# Patient Record
Sex: Female | Born: 1965 | Race: White | Hispanic: No | Marital: Single | Smoking: Never smoker
Health system: Southern US, Community
[De-identification: ages and names within clinical notes are randomized; demographics above are authoritative.]

## PROBLEM LIST (undated history)

## (undated) DIAGNOSIS — E663 Overweight: Secondary | ICD-10-CM

## (undated) DIAGNOSIS — R350 Frequency of micturition: Secondary | ICD-10-CM

## (undated) DIAGNOSIS — K219 Gastro-esophageal reflux disease without esophagitis: Secondary | ICD-10-CM

## (undated) DIAGNOSIS — R351 Nocturia: Secondary | ICD-10-CM

## (undated) HISTORY — DX: Nocturia: R35.1

## (undated) HISTORY — DX: Gastro-esophageal reflux disease without esophagitis: K21.9

## (undated) HISTORY — DX: Essential (primary) hypertension: I10

## (undated) HISTORY — DX: Overweight: E66.3

## (undated) HISTORY — DX: Frequency of micturition: R35.0

---

## 2004-07-28 ENCOUNTER — Ambulatory Visit: Payer: Self-pay | Admitting: Internal Medicine

## 2004-07-29 ENCOUNTER — Ambulatory Visit: Payer: Self-pay | Admitting: Internal Medicine

## 2004-09-26 ENCOUNTER — Other Ambulatory Visit: Payer: Self-pay

## 2004-09-26 ENCOUNTER — Emergency Department: Payer: Self-pay | Admitting: Emergency Medicine

## 2004-11-11 ENCOUNTER — Encounter: Payer: Self-pay | Admitting: Orthopedic Surgery

## 2004-12-02 ENCOUNTER — Encounter: Payer: Self-pay | Admitting: Orthopedic Surgery

## 2005-01-01 ENCOUNTER — Encounter: Payer: Self-pay | Admitting: Orthopedic Surgery

## 2005-02-01 ENCOUNTER — Encounter: Payer: Self-pay | Admitting: Orthopedic Surgery

## 2005-03-18 ENCOUNTER — Ambulatory Visit: Payer: Self-pay

## 2005-07-30 ENCOUNTER — Ambulatory Visit: Payer: Self-pay | Admitting: Internal Medicine

## 2006-09-28 ENCOUNTER — Ambulatory Visit: Payer: Self-pay | Admitting: Internal Medicine

## 2007-09-01 ENCOUNTER — Ambulatory Visit: Payer: Self-pay | Admitting: Internal Medicine

## 2007-09-29 ENCOUNTER — Ambulatory Visit: Payer: Self-pay | Admitting: Internal Medicine

## 2008-10-21 ENCOUNTER — Ambulatory Visit: Payer: Self-pay | Admitting: Family Medicine

## 2008-10-23 ENCOUNTER — Ambulatory Visit: Payer: Self-pay | Admitting: Internal Medicine

## 2009-02-03 ENCOUNTER — Ambulatory Visit: Payer: Self-pay | Admitting: Gastroenterology

## 2009-11-18 ENCOUNTER — Ambulatory Visit: Payer: Self-pay | Admitting: Internal Medicine

## 2010-07-01 ENCOUNTER — Ambulatory Visit: Payer: Self-pay

## 2010-12-29 ENCOUNTER — Ambulatory Visit: Payer: Self-pay

## 2011-07-31 ENCOUNTER — Emergency Department: Payer: Self-pay | Admitting: *Deleted

## 2011-08-01 LAB — COMPREHENSIVE METABOLIC PANEL
Albumin: 4.4 g/dL (ref 3.4–5.0)
Alkaline Phosphatase: 96 U/L (ref 50–136)
BUN: 11 mg/dL (ref 7–18)
Bilirubin,Total: 0.4 mg/dL (ref 0.2–1.0)
Calcium, Total: 9.5 mg/dL (ref 8.5–10.1)
Chloride: 104 mmol/L (ref 98–107)
Glucose: 125 mg/dL — ABNORMAL HIGH (ref 65–99)
Potassium: 3.8 mmol/L (ref 3.5–5.1)
SGOT(AST): 22 U/L (ref 15–37)
SGPT (ALT): 23 U/L
Total Protein: 8.1 g/dL (ref 6.4–8.2)

## 2011-08-01 LAB — CBC
HGB: 14.4 g/dL (ref 12.0–16.0)
MCHC: 33.5 g/dL (ref 32.0–36.0)
MCV: 95 fL (ref 80–100)
Platelet: 210 10*3/uL (ref 150–440)
RDW: 12.7 % (ref 11.5–14.5)

## 2011-08-01 LAB — URINALYSIS, COMPLETE
Ketone: NEGATIVE
Protein: NEGATIVE
RBC,UR: NONE SEEN /HPF (ref 0–5)
Specific Gravity: 1.003 (ref 1.003–1.030)
WBC UR: NONE SEEN /HPF (ref 0–5)

## 2011-08-01 LAB — LIPASE, BLOOD: Lipase: 123 U/L (ref 73–393)

## 2011-08-01 LAB — PREGNANCY, URINE: Pregnancy Test, Urine: NEGATIVE m[IU]/mL

## 2011-12-02 ENCOUNTER — Emergency Department: Payer: Self-pay | Admitting: Unknown Physician Specialty

## 2012-01-03 ENCOUNTER — Ambulatory Visit: Payer: Self-pay

## 2013-01-03 ENCOUNTER — Ambulatory Visit: Payer: Self-pay

## 2014-01-04 ENCOUNTER — Ambulatory Visit: Payer: Self-pay

## 2014-09-27 ENCOUNTER — Encounter: Payer: Self-pay | Admitting: *Deleted

## 2014-10-08 ENCOUNTER — Ambulatory Visit (INDEPENDENT_AMBULATORY_CARE_PROVIDER_SITE_OTHER): Payer: 59 | Admitting: Urology

## 2014-10-08 ENCOUNTER — Encounter: Payer: Self-pay | Admitting: Urology

## 2014-10-08 VITALS — BP 124/77 | HR 63 | Ht 64.75 in | Wt 182.2 lb

## 2014-10-08 DIAGNOSIS — R351 Nocturia: Secondary | ICD-10-CM | POA: Insufficient documentation

## 2014-10-08 DIAGNOSIS — R350 Frequency of micturition: Secondary | ICD-10-CM | POA: Insufficient documentation

## 2014-10-08 LAB — BLADDER SCAN AMB NON-IMAGING: Scan Result: 0

## 2014-10-08 NOTE — Progress Notes (Signed)
10/08/2014 10:11 AM   Margaret Lynch 06/23/1965 [id number]  Referring provider: No referring provider defined for this encounter.  Chief Complaint  Patient presents with  . Urinary Frequency    3 month follow up  . Nocturia    HPI: Patient is a 49 year old white female with a history of urinary frequency and nocturia off and on for several years who was started on Myrbetriq 25 mg daily. She had been inconsistent with taking the Myrbetriq and at her last visit we discussed ways to help her remember to take it every day. She presents today hopefully after taking Myrbetriq on a consistent basis.  Patient did not notice any difference while she was taking the Myrbetriq 25 mg daily.  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  She is experiencing nocturia x 1.  She denies any gross hematuria, suprapubic pain or dysuria.  She also denies any recent fevers, chills, nausea or vomiting.     PMH: Past Medical History  Diagnosis Date  . Acid reflux   . HTN (hypertension)   . Overweight   . Nocturia   . Urinary frequency     Surgical History: No past surgical history on file.  Home Medications:    Medication List       This list is accurate as of: 10/08/14 10:11 AM.  Always use your most recent med list.               MYRBETRIQ 25 MG Tb24 tablet  Generic drug:  mirabegron ER  Take 25 mg by mouth daily.     omeprazole 40 MG capsule  Commonly known as:  PRILOSEC  Take 40 mg by mouth daily.     valsartan-hydrochlorothiazide 320-12.5 MG per tablet  Commonly known as:  DIOVAN-HCT  Take 1 tablet by mouth daily.        Allergies: No Known Allergies  Family History: Family History  Problem Relation Age of Onset  . Kidney disease Neg Hx   . Prostate cancer Neg Hx   . Bladder Cancer Neg Hx     Social History:  reports that she has never smoked. She does not have any smokeless tobacco history on file. She  reports that she does not drink alcohol or use illicit drugs.  ROS: UROLOGY Frequent Urination?: Yes Hard to postpone urination?: No Burning/pain with urination?: No Get up at night to urinate?: Yes Leakage of urine?: No Urine stream starts and stops?: No Trouble starting stream?: No Do you have to strain to urinate?: No Blood in urine?: No Urinary tract infection?: No Sexually transmitted disease?: No Injury to kidneys or bladder?: No Painful intercourse?: No Weak stream?: No Currently pregnant?: No Vaginal bleeding?: No Last menstrual period?: n  Gastrointestinal Nausea?: No Vomiting?: No Indigestion/heartburn?: No Diarrhea?: No Constipation?: No  Constitutional Fever: No Night sweats?: No Weight loss?: No Fatigue?: No  Skin Skin rash/lesions?: No Itching?: No  Eyes Blurred vision?: No Double vision?: No  Ears/Nose/Throat Sore throat?: Yes Sinus problems?: No  Hematologic/Lymphatic Swollen glands?: No Easy bruising?: No  Cardiovascular Leg swelling?: No Chest pain?: No  Respiratory Cough?: No Shortness of breath?: No  Endocrine Excessive thirst?: No  Musculoskeletal Back pain?: No Joint pain?: No  Neurological Headaches?: No Dizziness?: No  Psychologic Depression?: No Anxiety?: No  Physical Exam: BP 124/77 mmHg  Pulse 63  Ht 5' 4.75" (1.645 m)  Wt 182 lb 3.2 oz (82.645  kg)  BMI 30.54 kg/m2   Pertinent Imaging: Results for orders placed or performed in visit on 10/08/14  BLADDER SCAN AMB NON-IMAGING  Result Value Ref Range   Scan Result 0     Assessment & Plan:    1. Urinary frequency:   Patient did not find any difference with the Myrbetriq in her urinary frequency.  We discussed trying an anti-cholinergic, increasing the dose of Myrbetriq, PNE, Botox and PTNS as options for her urinary symptoms.  She would like to pursue the PTNS.  The procedure and contraindications are discussed with the patient.  She has no  contraindications and would like to proceed with the therapy.  I have given her the  Brochure and she will schedule her first PTNS.  - BLADDER SCAN AMB NON-IMAGING  2. Nocturia:  Patient is only getting up one night a week.  She is not finding it bothersome at this time.  We will continue to monitor.    Return for PTNS.  Michiel Cowboy, PA-C  [facility] [address] [place], [place] [address] [phone]

## 2014-10-18 ENCOUNTER — Telehealth: Payer: Self-pay | Admitting: *Deleted

## 2014-10-18 NOTE — Telephone Encounter (Signed)
LMOM for patient to call back. Carollee Herter wants this patient to have PTNS. I was trying to see if patient would need prior auth and there is no insurance cards on file for this patient. We need her to bring a current copy of her insurance card to be scanned in before I can start to see if she is covered for PTNS or not. I would also like to be notified by the patient when this is done. If patient can tell the front desk when she brings insurance card in to let Ramona know its in the system.

## 2014-10-21 NOTE — Telephone Encounter (Signed)
Patient called back and stated she wants to wait and not go through with the PTNS treatments at this time. Patient to call and let us know when she would like to proceed.

## 2014-10-21 NOTE — Telephone Encounter (Signed)
LMOM for patient to return call about her insurance, trying to get her PTNS approved. Patient hasn't had insurance card scanned in since feb 2016 in [employer]scripts. Trying to make sure patient still has the same insurance.

## 2014-11-05 ENCOUNTER — Emergency Department
Admission: EM | Admit: 2014-11-05 | Discharge: 2014-11-05 | Disposition: A | Discharge status: Home / Self Care | Payer: 59 | Attending: Emergency Medicine | Admitting: Emergency Medicine

## 2014-11-05 ENCOUNTER — Emergency Department: Payer: 59

## 2014-11-05 ENCOUNTER — Other Ambulatory Visit: Payer: Self-pay

## 2014-11-05 ENCOUNTER — Encounter: Payer: Self-pay | Admitting: *Deleted

## 2014-11-05 DIAGNOSIS — Z79899 Other long term (current) drug therapy: Secondary | ICD-10-CM | POA: Insufficient documentation

## 2014-11-05 DIAGNOSIS — R51 Headache: Secondary | ICD-10-CM | POA: Insufficient documentation

## 2014-11-05 DIAGNOSIS — R55 Syncope and collapse: Secondary | ICD-10-CM | POA: Insufficient documentation

## 2014-11-05 DIAGNOSIS — I10 Essential (primary) hypertension: Secondary | ICD-10-CM | POA: Diagnosis not present

## 2014-11-05 DIAGNOSIS — M542 Cervicalgia: Secondary | ICD-10-CM | POA: Diagnosis not present

## 2014-11-05 LAB — COMPREHENSIVE METABOLIC PANEL
ALK PHOS: 75 U/L (ref 38–126)
ALT: 21 U/L (ref 14–54)
AST: 22 U/L (ref 15–41)
Albumin: 4.4 g/dL (ref 3.5–5.0)
Anion gap: 6 (ref 5–15)
BILIRUBIN TOTAL: 0.7 mg/dL (ref 0.3–1.2)
BUN: 15 mg/dL (ref 6–20)
CHLORIDE: 100 mmol/L — AB (ref 101–111)
CO2: 29 mmol/L (ref 22–32)
Calcium: 9.5 mg/dL (ref 8.9–10.3)
Creatinine, Ser: 0.88 mg/dL (ref 0.44–1.00)
GFR calc non Af Amer: >60 mL/min (ref >60)
Glucose, Bld: 124 mg/dL — ABNORMAL HIGH (ref 65–99)
Potassium: 3.7 mmol/L (ref 3.5–5.1)
Sodium: 135 mmol/L (ref 135–145)
Total Protein: 7.7 g/dL (ref 6.5–8.1)

## 2014-11-05 LAB — CBC WITH DIFFERENTIAL/PLATELET
Basophils Absolute: 0.1 10*3/uL (ref 0–0.1)
Basophils Relative: 1 %
Eosinophils Absolute: 0.1 10*3/uL (ref 0–0.7)
Eosinophils Relative: 1 %
HEMATOCRIT: 42.1 % (ref 35.0–47.0)
HEMOGLOBIN: 14.4 g/dL (ref 12.0–16.0)
LYMPHS ABS: 1.4 10*3/uL (ref 1.0–3.6)
Lymphocytes Relative: 13 %
MCH: 31.5 pg (ref 26.0–34.0)
MCHC: 34.2 g/dL (ref 32.0–36.0)
MCV: 92.1 fL (ref 80.0–100.0)
MONOS PCT: 8 %
Monocytes Absolute: 0.8 10*3/uL (ref 0.2–0.9)
NEUTROS ABS: 8 10*3/uL — AB (ref 1.4–6.5)
NEUTROS PCT: 77 %
Platelets: 201 10*3/uL (ref 150–440)
RBC: 4.57 MIL/uL (ref 3.80–5.20)
RDW: 12.8 % (ref 11.5–14.5)
WBC: 10.4 10*3/uL (ref 3.6–11.0)

## 2014-11-05 LAB — TROPONIN I
Troponin I: <0.03 ng/mL (ref <0.031)
Troponin I: <0.03 ng/mL (ref <0.031)

## 2014-11-05 NOTE — ED Notes (Addendum)
Pt at work today took her Abx as prescribed per dentist, after taking second she had a syncopal episode,. positive LOC, C-collar in place per EMS. CBG 132 per EMS.  No neuro changes noted at this time.

## 2014-11-05 NOTE — ED Notes (Signed)
Patient transported to CT 

## 2014-11-05 NOTE — ED Notes (Signed)
Patient transported to X-ray 

## 2014-11-05 NOTE — ED Provider Notes (Signed)
[facility] Emergency Department Provider Note  ____________________________________________  Time seen: Approximately 3:52 PM  I have reviewed the triage vital signs and the nursing notes.   HISTORY  Chief Complaint Loss of Consciousness   HPI Margaret Lynch is a 49 y.o. female patient reports she passed out. Patient reports she had taken 2/ 500 mg amoxicillin which she is starting for treatment of tooth abscess and then about an hour later had lunch she was in a meeting and that she after she stood up from sitting down at the meeting and was walking down the hallway she said things began to look funny and she passed out. I do not have an available description of what she acted like but apparently she just was on the floor she remembers her coworkers bending over her asking her she is okay. Present she did describes a mild diffuse headache and a little bit of pain around the area of C7 in her neck. Thing makes the headache pain better or worse that she could remember seemed to have brought on the syncope she had not had syncope previously  Past Medical History  Diagnosis Date  . Acid reflux   . HTN (hypertension)   . Overweight   . Nocturia   . Urinary frequency     Patient Active Problem List   Diagnosis Date Noted  . Urinary frequency 10/08/2014  . Nocturia 10/08/2014    History reviewed. No pertinent past surgical history.  Current Outpatient Rx  Name  Route  Sig  Dispense  Refill  . mirabegron ER (MYRBETRIQ) 25 MG TB24 tablet   Oral   Take 25 mg by mouth daily.         Marland Kitchen omeprazole (PRILOSEC) 40 MG capsule   Oral   Take 40 mg by mouth daily.         . valsartan-hydrochlorothiazide (DIOVAN-HCT) 320-12.5 MG per tablet   Oral   Take 1 tablet by mouth daily.           Allergies Review of patient's allergies indicates no known allergies.  Family History  Problem Relation Age of Onset  . Kidney disease Neg Hx   . Prostate cancer  Neg Hx   . Bladder Cancer Neg Hx     Social History Social History  Substance Use Topics  . Smoking status: Never Smoker   . Smokeless tobacco: Never Used  . Alcohol Use: No    Review of Systems Constitutional: No fever/chills Eyes: No visual changes. ENT: No sore throat. Cardiovascular: Denies chest pain. Respiratory: Denies shortness of breath. Gastrointestinal: No abdominal pain.  No nausea, no vomiting.  No diarrhea.  No constipation. Genitourinary: Negative for dysuria. Musculoskeletal: Negative for back pain. Skin: Negative for rash. Neurological: Negative for headaches, focal weakness or numbness.  10-point ROS otherwise negative.  ____________________________________________   PHYSICAL EXAM:  VITAL SIGNS: ED Triage Vitals  Enc Vitals Group     BP --      Pulse --      Resp --      Temp --      Temp src --      SpO2 --      Weight --      Height --      Head Cir --      Peak Flow --      Pain Score --      Pain Loc --      Pain Edu? --  Excl. in GC? --     Constitutional: Alert and oriented. Well appearing and in no acute distress. Eyes: Conjunctivae are normal. PERRL. EOMI. Head: Atraumatic. Nose: No congestion/rhinnorhea. Mouth/Throat: Mucous membranes are moist.  Oropharynx non-erythematous. Neck: No stridor.  Slight tenderness to the immediate left of C7 Cardiovascular: Normal rate, regular rhythm. Grossly normal heart sounds.  Good peripheral circulation. Respiratory: Normal respiratory effort.  No retractions. Lungs CTAB. Gastrointestinal: Soft and nontender. No distention. No abdominal bruits. No CVA tenderness. Musculoskeletal: No lower extremity tenderness nor edema.  No joint effusions. Neurologic:  Normal speech and language. No gross focal neurologic deficits are appreciated. No gait instability. Cranial nerves II through XII are intact cerebellar finger-nose rapid alternating movements in the fingers are normal motor strength is 5  over 5 throughout sensation is intact throughout Skin:  Skin is warm, dry and intact. No rash noted. Psychiatric: Mood and affect are normal. Speech and behavior are normal.  ____________________________________________   LABS (all labs ordered are listed, but only abnormal results are displayed)  Labs Reviewed  COMPREHENSIVE METABOLIC PANEL - Abnormal; Notable for the following:    Chloride 100 (*)    Glucose, Bld 124 (*)    All other components within normal limits  CBC WITH DIFFERENTIAL/PLATELET - Abnormal; Notable for the following:    Neutro Abs 8.0 (*)    All other components within normal limits  TROPONIN I  TROPONIN I   ____________________________________________  EKG  EKG read and interpreted by me shows normal sinus rhythm rate of 78 left axis nonspecific ST-T wave changes much markedly improved from when she had 2006. ____________________________________________  RADIOLOGY  Chest x-ray shows no acute disease per radiology. CT the head shows no acute disease per radiology. CT of the neck shows moderate severe spinal stenosis at several levels per radiology. ____________________________________________   PROCEDURES    ____________________________________________   INITIAL IMPRESSION / ASSESSMENT AND PLAN / ED COURSE  Pertinent labs & imaging results that were available during my care of the patient were reviewed by me and considered in my medical decision making (see chart for details). Patient had no symptoms of allergic reaction no itching palpitations returning in the throat or anything else there was no shortness breath I do not believe the syncope was related to the amoxicillin. Patient's CT of the neck I discussed with Dr. Mikal Plane CABG being [facility] neurosurgeon on-call at [facility] we will follow her up in the office but again there are no symptoms of numbness or weakness in any extremities finally I will have her follow up with cardiology Dr. Gwen Pounds is on call  tonight I will have her call them in the morning for follow-up should be tomorrow ____________________________________________   FINAL CLINICAL IMPRESSION(S) / ED DIAGNOSES  Final diagnoses:  Collapse      Arnaldo Natal, MD 11/05/14 2129

## 2014-11-05 NOTE — ED Notes (Signed)
Pt up ad lib to BR.  Await lab result, then dispo.

## 2014-11-05 NOTE — ED Notes (Signed)
C-spine CT negative asked Dr Darnelle Catalan to remove C-collar.  Approval to remove.  Friends at bedside. Await dispo.

## 2014-11-05 NOTE — Discharge Instructions (Signed)
Syncope °Syncope is a medical term for fainting or passing out. This means you lose consciousness and drop to the ground. People are generally unconscious for less than 5 minutes. You may have some muscle twitches for up to 15 seconds before waking up and returning to normal. Syncope occurs more often in older adults, but it can happen to anyone. While most causes of syncope are not dangerous, syncope can be a sign of a serious medical problem. It is important to seek medical care.  °CAUSES  °Syncope is caused by a sudden drop in blood flow to the brain. The specific cause is often not determined. Factors that can bring on syncope include: °· Taking medicines that lower blood pressure. °· Sudden changes in posture, such as standing up quickly. °· Taking more medicine than prescribed. °· Standing in one place for too long. °· Seizure disorders. °· Dehydration and excessive exposure to heat. °· Low blood sugar (hypoglycemia). °· Straining to have a bowel movement. °· Heart disease, irregular heartbeat, or other circulatory problems. °· Fear, emotional distress, seeing blood, or severe pain. °SYMPTOMS  °Right before fainting, you may: °· Feel dizzy or light-headed. °· Feel nauseous. °· See all white or all black in your field of vision. °· Have cold, clammy skin. °DIAGNOSIS  °Your health care provider will ask about your symptoms, perform a physical exam, and perform an electrocardiogram (ECG) to record the electrical activity of your heart. Your health care provider may also perform other heart or blood tests to determine the cause of your syncope which may include: °· Transthoracic echocardiogram (TTE). During echocardiography, sound waves are used to evaluate how blood flows through your heart. °· Transesophageal echocardiogram (TEE). °· Cardiac monitoring. This allows your health care provider to monitor your heart rate and rhythm in real time. °· Holter monitor. This is a portable device that records your  heartbeat and can help diagnose heart arrhythmias. It allows your health care provider to track your heart activity for several days, if needed. °· Stress tests by exercise or by giving medicine that makes the heart beat faster. °TREATMENT  °In most cases, no treatment is needed. Depending on the cause of your syncope, your health care provider may recommend changing or stopping some of your medicines. °HOME CARE INSTRUCTIONS °· Have someone stay with you until you feel stable. °· Do not drive, use machinery, or play sports until your health care provider says it is okay. °· Keep all follow-up appointments as directed by your health care provider. °· Lie down right away if you start feeling like you might faint. Breathe deeply and steadily. Wait until all the symptoms have passed. °· Drink enough fluids to keep your urine clear or pale yellow. °· If you are taking blood pressure or heart medicine, get up slowly and take several minutes to sit and then stand. This can reduce dizziness. °SEEK IMMEDIATE MEDICAL CARE IF:  °· You have a severe headache. °· You have unusual pain in the chest, abdomen, or back. °· You are bleeding from your mouth or rectum, or you have black or tarry stool. °· You have an irregular or very fast heartbeat. °· You have pain with breathing. °· You have repeated fainting or seizure-like jerking during an episode. °· You faint when sitting or lying down. °· You have confusion. °· You have trouble walking. °· You have severe weakness. °· You have vision problems. °If you fainted, call your local emergency services (911 in [place] Do not drive   yourself to the hospital.    This information is not intended to replace advice given to you by your health care provider. Make sure you discuss any questions you have with your health care provider.   Document Released: 01/18/2005 Document Revised: 06/04/2014 Document Reviewed: 03/19/2011 Elsevier Interactive Patient Education 2016 Elsevier  Inc. Please call Dr. Izell Wheatfields and Ihor Austin tomorrow to set up an appointment hopefully tomorrow to follow-up the syncope or passing out episode that she had today. Please return for any further problems with your state of consciousness before that. Return for any chest pain or shortness of breath as well. As I discussed with you about the CAT scan of the neck there is some spinal stenosis going on on the CAT scan. Not finding any symptoms however since it's present on the CT scan I will ask you to follow-up with neurosurgery at [facility] the phone number is also in the follow-up section. They will evaluate [facility] if there is anything that might need to be done at sometime in the future. If you do develop numbness or weakness in her arms or legs at would be something important to follow up with them for.

## 2014-11-06 DIAGNOSIS — R002 Palpitations: Secondary | ICD-10-CM | POA: Insufficient documentation

## 2014-11-06 DIAGNOSIS — Z87898 Personal history of other specified conditions: Secondary | ICD-10-CM | POA: Insufficient documentation

## 2014-11-06 DIAGNOSIS — R0609 Other forms of dyspnea: Secondary | ICD-10-CM | POA: Insufficient documentation

## 2014-11-12 DIAGNOSIS — I10 Essential (primary) hypertension: Secondary | ICD-10-CM | POA: Insufficient documentation

## 2014-11-25 ENCOUNTER — Other Ambulatory Visit: Payer: Self-pay | Admitting: Nurse Practitioner

## 2014-11-25 DIAGNOSIS — Z1231 Encounter for screening mammogram for malignant neoplasm of breast: Secondary | ICD-10-CM

## 2015-01-06 ENCOUNTER — Ambulatory Visit
Admission: RE | Admit: 2015-01-06 | Discharge: 2015-01-06 | Disposition: A | Discharge status: Home / Self Care | Payer: 59 | Source: Ambulatory Visit | Attending: Nurse Practitioner | Admitting: Nurse Practitioner

## 2015-01-06 DIAGNOSIS — Z1231 Encounter for screening mammogram for malignant neoplasm of breast: Secondary | ICD-10-CM | POA: Insufficient documentation

## 2015-03-20 DIAGNOSIS — I493 Ventricular premature depolarization: Secondary | ICD-10-CM | POA: Insufficient documentation

## 2015-12-01 ENCOUNTER — Other Ambulatory Visit: Payer: Self-pay | Admitting: Nurse Practitioner

## 2015-12-01 DIAGNOSIS — Z1231 Encounter for screening mammogram for malignant neoplasm of breast: Secondary | ICD-10-CM

## 2016-01-07 ENCOUNTER — Ambulatory Visit
Admission: RE | Admit: 2016-01-07 | Discharge: 2016-01-07 | Disposition: A | Discharge status: Home / Self Care | Payer: 59 | Source: Ambulatory Visit | Attending: Nurse Practitioner | Admitting: Nurse Practitioner

## 2016-01-07 DIAGNOSIS — Z1231 Encounter for screening mammogram for malignant neoplasm of breast: Secondary | ICD-10-CM

## 2017-01-07 ENCOUNTER — Other Ambulatory Visit: Payer: Self-pay | Admitting: Nurse Practitioner

## 2017-01-07 DIAGNOSIS — Z1231 Encounter for screening mammogram for malignant neoplasm of breast: Secondary | ICD-10-CM

## 2017-01-13 ENCOUNTER — Other Ambulatory Visit: Payer: Self-pay | Admitting: Otolaryngology

## 2017-01-13 DIAGNOSIS — R221 Localized swelling, mass and lump, neck: Secondary | ICD-10-CM

## 2017-01-27 ENCOUNTER — Encounter (INDEPENDENT_AMBULATORY_CARE_PROVIDER_SITE_OTHER): Payer: Self-pay

## 2017-01-27 ENCOUNTER — Ambulatory Visit
Admission: RE | Admit: 2017-01-27 | Discharge: 2017-01-27 | Disposition: A | Discharge status: Home / Self Care | Payer: Managed Care, Other (non HMO) | Source: Ambulatory Visit | Attending: Otolaryngology | Admitting: Otolaryngology

## 2017-01-27 DIAGNOSIS — R221 Localized swelling, mass and lump, neck: Secondary | ICD-10-CM

## 2017-01-28 ENCOUNTER — Ambulatory Visit
Admission: RE | Admit: 2017-01-28 | Discharge: 2017-01-28 | Disposition: A | Discharge status: Home / Self Care | Payer: Managed Care, Other (non HMO) | Source: Ambulatory Visit | Attending: Otolaryngology | Admitting: Otolaryngology

## 2017-01-28 DIAGNOSIS — E041 Nontoxic single thyroid nodule: Secondary | ICD-10-CM | POA: Insufficient documentation

## 2017-01-28 DIAGNOSIS — R221 Localized swelling, mass and lump, neck: Secondary | ICD-10-CM | POA: Diagnosis present

## 2017-01-28 LAB — POCT I-STAT CREATININE: Creatinine, Ser: 0.7 mg/dL (ref 0.44–1.00)

## 2017-01-28 MED ORDER — IOPAMIDOL (ISOVUE-300) INJECTION 61%
75.0000 mL | Freq: Once | INTRAVENOUS | Status: AC | PRN
  Administered 2017-01-28: 75 mL via INTRAVENOUS

## 2017-02-04 ENCOUNTER — Ambulatory Visit
Admission: RE | Admit: 2017-02-04 | Discharge: 2017-02-04 | Disposition: A | Discharge status: Home / Self Care | Payer: Managed Care, Other (non HMO) | Source: Ambulatory Visit | Attending: Nurse Practitioner | Admitting: Nurse Practitioner

## 2017-02-04 DIAGNOSIS — Z1231 Encounter for screening mammogram for malignant neoplasm of breast: Secondary | ICD-10-CM | POA: Insufficient documentation

## 2017-02-09 ENCOUNTER — Ambulatory Visit: Payer: Managed Care, Other (non HMO) | Admitting: Internal Medicine

## 2017-02-09 ENCOUNTER — Encounter: Payer: Self-pay | Admitting: Internal Medicine

## 2017-02-09 VITALS — BP 149/86 | HR 74 | Temp 97.6°F | Resp 16 | Ht 64.0 in | Wt 198.2 lb

## 2017-02-09 DIAGNOSIS — H6120 Impacted cerumen, unspecified ear: Secondary | ICD-10-CM

## 2017-02-09 DIAGNOSIS — I10 Essential (primary) hypertension: Secondary | ICD-10-CM | POA: Diagnosis not present

## 2017-02-09 DIAGNOSIS — R05 Cough: Secondary | ICD-10-CM | POA: Diagnosis not present

## 2017-02-09 DIAGNOSIS — R058 Other specified cough: Secondary | ICD-10-CM

## 2017-02-09 DIAGNOSIS — J0100 Acute maxillary sinusitis, unspecified: Secondary | ICD-10-CM | POA: Diagnosis not present

## 2017-02-09 MED ORDER — BISOPROLOL FUMARATE 5 MG PO TABS: ORAL_TABLET | ORAL | 3 refills | Status: DC

## 2017-02-09 MED ORDER — CARBAMIDE PEROXIDE 6.5 % OT SOLN: 5.0000 [drp] | Freq: Every day | OTIC | Status: AC

## 2017-02-09 MED ORDER — AMOXICILLIN-POT CLAVULANATE 875-125 MG PO TABS: 1.0000 | ORAL_TABLET | Freq: Two times a day (BID) | ORAL | 0 refills | Status: DC

## 2017-02-09 MED ORDER — CARBAMIDE PEROXIDE 6.5 % OT SOLN: 5.0000 [drp] | Freq: Every day | OTIC | 0 refills | Status: AC

## 2017-02-09 NOTE — Progress Notes (Signed)
[facility] [address] [place], [place] [id number]  Internal MEDICINE  Office Visit Note  Patient Name: Margaret FlamingLinda C Lynch  [id number]  [id number]  Date of Service: 02/09/2017     Complaints/HPI:  Pt is here for a sick visit. Chief Complaint  Patient presents with  . Cough    deep non-productive x 1 1/2 wks  . Sinusitis   Cough  This is a new problem. The current episode started 1 to 4 weeks ago. The problem has been gradually worsening. The problem occurs every few hours. The cough is productive of brown sputum. Associated symptoms include ear congestion, heartburn, rhinorrhea, a sore throat and shortness of breath. Pertinent negatives include no chest pain, chills, ear pain, eye redness, fever, headaches, myalgias, postnasal drip or wheezing. The symptoms are aggravated by exercise. She has tried OTC cough suppressant for the symptoms. The treatment provided mild relief. There is no history of environmental allergies.     Current Medication:  Outpatient Encounter Medications as of 02/09/2017  Medication Sig  . fluticasone (FLONASE) 50 MCG/ACT nasal spray Place 2 sprays into both nostrils daily.  Marland Kitchen. losartan (COZAAR) 100 MG tablet Take 100 mg by mouth daily.  . mirabegron ER (MYRBETRIQ) 25 MG TB24 tablet Take 25 mg by mouth daily.  Marland Kitchen. omeprazole (PRILOSEC) 40 MG capsule Take 40 mg by mouth daily.  . valsartan-hydrochlorothiazide (DIOVAN-HCT) 320-12.5 MG per tablet Take 1 tablet by mouth daily.   No facility-administered encounter medications on file as of 02/09/2017.       Medical History: Past Medical History:  Diagnosis Date  . Acid reflux   . HTN (hypertension)   . Nocturia   . Overweight   . Urinary frequency      Vital Signs: BP (!) 149/86 (BP Location: Left Arm, Patient Position: Sitting)   Pulse 74   Temp 97.6 F (36.4 C) (Oral)   Resp 16   Ht 5\' 4"  (1.626 m)   Wt 198 lb 3.2 oz (89.9 kg)   SpO2 98%   BMI 34.02 kg/m   Review of Systems   Constitutional: Negative for appetite change, chills, fatigue, fever and unexpected weight change.  HENT: Positive for rhinorrhea and sore throat. Negative for congestion, ear pain, postnasal drip, sinus pressure, sinus pain, tinnitus and trouble swallowing.   Eyes: Negative for photophobia, discharge, redness, itching and visual disturbance.  Respiratory: Positive for cough and shortness of breath. Negative for apnea, choking, chest tightness and wheezing.   Cardiovascular: Negative for chest pain, palpitations and leg swelling.  Gastrointestinal: Positive for heartburn. Negative for abdominal distention, blood in stool, constipation, diarrhea, nausea and vomiting.  Endocrine: Negative for polydipsia, polyphagia and polyuria.  Genitourinary: Negative for difficulty urinating, dyspareunia, dysuria, flank pain, frequency, menstrual problem, pelvic pain and vaginal bleeding.  Musculoskeletal: Negative for arthralgias, back pain, gait problem, joint swelling, myalgias and neck pain.  Allergic/Immunologic: Negative for environmental allergies and food allergies.  Neurological: Negative for dizziness, tremors, syncope, weakness and headaches.  Hematological: Negative for adenopathy. Does not bruise/bleed easily.  Psychiatric/Behavioral: Negative for agitation, dysphoric mood, hallucinations, self-injury and suicidal ideas. The patient is not nervous/anxious.     Physical Exam  Constitutional: She is oriented to person, place, and time. She appears well-developed and well-nourished. No distress.  HENT:  Head: Normocephalic and atraumatic.  Mouth/Throat: Oropharynx is clear and moist. No oropharyngeal exudate.  Eyes: EOM are normal. Pupils are equal, round, and reactive to light.  Neck: Normal range of motion. Neck supple. No  JVD present. No tracheal deviation present. No thyromegaly present.  Cardiovascular: Normal rate, regular rhythm and normal heart sounds. Exam reveals no gallop and no friction  rub.  No murmur heard. Pulmonary/Chest: Effort normal. No respiratory distress. She has no wheezes. She has no rales. She exhibits no tenderness.  Abdominal: Soft. Bowel sounds are normal.  Musculoskeletal: Normal range of motion.  Lymphadenopathy:    She has no cervical adenopathy.  Neurological: She is alert and oriented to person, place, and time. No cranial nerve deficit.  Skin: Skin is warm and dry. She is not diaphoretic.  Psychiatric: She has a normal mood and affect. Her behavior is normal. Judgment and thought content normal.      Assessment/Plan: 1. Cough productive of purulent sputum - Add Augmentin 875 mg bid, might need Ct scan of sinuses    2. Acute non-recurrent maxillary sinusitis - Augmentin 875 mg po bid   3. Benign essential HTN - Add Bisoprolol 2.5 mg qd to Losartan   4. Wax in ear - carbamide peroxide (DEBROX) 6.5 % OTIC (EAR) solution 5 drop  General Counseling : I have discussed the findings of the evaluation and examination with Margaret Lynch.  I have also discussed any further diagnostic evaluation that may be needed or ordered today. Margaret Lynch verbalizes understanding of the findings of todays visit. We also reviewed her medications today. she has been encouraged to call the office with any questions or concerns that should arise related to todays visit.    Counseling: Hypertension Counseling:   The following hypertensive lifestyle modification were recommended and discussed:  1. Limiting alcohol intake to less than 1 oz/day of ethanol:(24 oz of beer or 8 oz of wine or 2 oz of 100-proof whiskey). 2. Take baby ASA 81 mg daily. 3. Importance of regular aerobic exercise and losing weight. 4. Reduce dietary saturated fat and cholesterol intake for overall cardiovascular health. 5. Maintaining adequate dietary potassium, calcium, and magnesium intake. 6. Regular monitoring of the blood pressure. 7. Reduce sodium intake to less than 100 mmol/day (less than 2.3 gm of  sodium or less than 6 gm of sodium choride)      Time spent: 25

## 2017-02-15 ENCOUNTER — Other Ambulatory Visit: Payer: Self-pay

## 2017-02-15 ENCOUNTER — Other Ambulatory Visit: Payer: Self-pay | Admitting: Nurse Practitioner

## 2017-02-15 DIAGNOSIS — I10 Essential (primary) hypertension: Secondary | ICD-10-CM

## 2017-02-15 MED ORDER — ATENOLOL 25 MG PO TABS: 25.0000 mg | ORAL_TABLET | Freq: Every day | ORAL | 3 refills | Status: DC

## 2017-02-15 NOTE — Progress Notes (Signed)
Bisoprolol discontinued. Added atenolol 25mg  at bedtime and sent new rx to [facility] [address].

## 2017-02-18 ENCOUNTER — Ambulatory Visit (INDEPENDENT_AMBULATORY_CARE_PROVIDER_SITE_OTHER): Payer: Managed Care, Other (non HMO)

## 2017-02-18 VITALS — BP 130/80 | HR 69 | Resp 16 | Ht 64.0 in | Wt 198.0 lb

## 2017-02-18 DIAGNOSIS — H9209 Otalgia, unspecified ear: Secondary | ICD-10-CM | POA: Diagnosis not present

## 2017-02-18 DIAGNOSIS — H6122 Impacted cerumen, left ear: Secondary | ICD-10-CM | POA: Diagnosis not present

## 2017-02-18 LAB — EAR CERUMEN REMOVAL

## 2017-04-11 ENCOUNTER — Encounter: Payer: Self-pay | Admitting: Emergency Medicine

## 2017-04-11 ENCOUNTER — Other Ambulatory Visit: Payer: Self-pay

## 2017-04-11 ENCOUNTER — Emergency Department
Admission: EM | Admit: 2017-04-11 | Discharge: 2017-04-11 | Disposition: A | Discharge status: Home / Self Care | Payer: Managed Care, Other (non HMO) | Attending: Emergency Medicine | Admitting: Emergency Medicine

## 2017-04-11 ENCOUNTER — Emergency Department: Payer: Managed Care, Other (non HMO)

## 2017-04-11 DIAGNOSIS — R55 Syncope and collapse: Secondary | ICD-10-CM

## 2017-04-11 DIAGNOSIS — R51 Headache: Secondary | ICD-10-CM | POA: Diagnosis not present

## 2017-04-11 DIAGNOSIS — Z79899 Other long term (current) drug therapy: Secondary | ICD-10-CM | POA: Insufficient documentation

## 2017-04-11 DIAGNOSIS — I10 Essential (primary) hypertension: Secondary | ICD-10-CM | POA: Insufficient documentation

## 2017-04-11 DIAGNOSIS — J069 Acute upper respiratory infection, unspecified: Secondary | ICD-10-CM | POA: Insufficient documentation

## 2017-04-11 DIAGNOSIS — R519 Headache, unspecified: Secondary | ICD-10-CM

## 2017-04-11 LAB — BASIC METABOLIC PANEL
Anion gap: 9 (ref 5–15)
BUN: 13 mg/dL (ref 6–20)
CHLORIDE: 98 mmol/L — AB (ref 101–111)
CO2: 27 mmol/L (ref 22–32)
CREATININE: 0.77 mg/dL (ref 0.44–1.00)
Calcium: 9.4 mg/dL (ref 8.9–10.3)
Glucose, Bld: 144 mg/dL — ABNORMAL HIGH (ref 65–99)
POTASSIUM: 4 mmol/L (ref 3.5–5.1)
SODIUM: 134 mmol/L — AB (ref 135–145)

## 2017-04-11 LAB — CBC
HEMATOCRIT: 42.7 % (ref 35.0–47.0)
Hemoglobin: 14.2 g/dL (ref 12.0–16.0)
MCH: 30.3 pg (ref 26.0–34.0)
MCHC: 33.2 g/dL (ref 32.0–36.0)
MCV: 91.4 fL (ref 80.0–100.0)
PLATELETS: 185 10*3/uL (ref 150–440)
RBC: 4.68 MIL/uL (ref 3.80–5.20)
RDW: 13.2 % (ref 11.5–14.5)
WBC: 7.3 10*3/uL (ref 3.6–11.0)

## 2017-04-11 LAB — GLUCOSE, CAPILLARY: Glucose-Capillary: 126 mg/dL — ABNORMAL HIGH (ref 65–99)

## 2017-04-11 MED ORDER — BUTALBITAL-APAP-CAFFEINE 50-325-40 MG PO TABS
1.0000 | ORAL_TABLET | Freq: Once | ORAL | Status: AC
  Administered 2017-04-11: 1 via ORAL
  Filled 2017-04-11: qty 1

## 2017-04-11 MED ORDER — BUTALBITAL-APAP-CAFFEINE 50-325-40 MG PO TABS: 1.0000 | ORAL_TABLET | Freq: Four times a day (QID) | ORAL | 0 refills | Status: AC | PRN

## 2017-04-11 NOTE — Discharge Instructions (Signed)
Fortunately today your blood work, your head CT, your EKG were reassuring.  Please make sure you remain well-hydrated and use your headache medicine as needed for severe symptoms.  Follow-up with your primary care physician as needed and return to the emergency department for any concerns whatsoever.  It was a pleasure to take care of you today, and thank you for coming to our emergency department.  If you have any questions or concerns before leaving please ask the nurse to grab me and I'm more than happy to go through your aftercare instructions again.  If you were prescribed any opioid pain medication today such as Norco, Vicodin, Percocet, morphine, hydrocodone, or oxycodone please make sure you do not drive when you are taking this medication as it can alter your ability to drive safely.  If you have any concerns once you are home that you are not improving or are in fact getting worse before you can make it to your follow-up appointment, please do not hesitate to call 911 and come back for further evaluation.  Merrily BrittleNeil Rhetta Cleek, MD  Results for orders placed or performed during the hospital encounter of 04/11/17  Glucose, capillary  Result Value Ref Range   Glucose-Capillary 126 (H) 65 - 99 mg/dL  Basic metabolic panel  Result Value Ref Range   Sodium 134 (L) 135 - 145 mmol/L   Potassium 4.0 3.5 - 5.1 mmol/L   Chloride 98 (L) 101 - 111 mmol/L   CO2 27 22 - 32 mmol/L   Glucose, Bld 144 (H) 65 - 99 mg/dL   BUN 13 6 - 20 mg/dL   Creatinine, Ser 0.980.77 0.44 - 1.00 mg/dL   Calcium 9.4 8.9 - 11.910.3 mg/dL   GFR calc non Af Amer >60 >60 mL/min   GFR calc Af Amer >60 >60 mL/min   Anion gap 9 5 - 15  CBC  Result Value Ref Range   WBC 7.3 3.6 - 11.0 K/uL   RBC 4.68 3.80 - 5.20 MIL/uL   Hemoglobin 14.2 12.0 - 16.0 g/dL   HCT 14.742.7 82.935.0 - 56.247.0 %   MCV 91.4 80.0 - 100.0 fL   MCH 30.3 26.0 - 34.0 pg   MCHC 33.2 32.0 - 36.0 g/dL   RDW 13.013.2 86.511.5 - 78.414.5 %   Platelets 185 150 - 440 K/uL   Ct Head  Wo Contrast  Result Date: 04/11/2017 CLINICAL DATA:  Initial evaluation for acute headache with syncope. EXAM: CT HEAD WITHOUT CONTRAST TECHNIQUE: Contiguous axial images were obtained from the base of the skull through the vertex without intravenous contrast. COMPARISON:  Prior CTs from 11/05/2014. FINDINGS: Brain: Cerebral volume within normal limits. No acute intracranial hemorrhage. No acute large vessel territory infarct. No mass lesion, midline shift or mass effect. No hydrocephalus. No extra-axial fluid collection. Vascular: No hyperdense vessel. Skull: Scalp soft tissues and calvarium within normal limits. Sinuses/Orbits: Globes and orbital soft tissues within normal limits. Mild scattered mucosal thickening within the ethmoidal air cells. Visualized paranasal sinuses are otherwise clear. No mastoid effusion. Other: None. IMPRESSION: 1. No acute intracranial process identified. 2. Mild ethmoidal sinus disease. Electronically Signed   By: Rise MuBenjamin  McClintock M.D.   On: 04/11/2017 15:55

## 2017-04-11 NOTE — ED Provider Notes (Signed)
[facility] Emergency Department Provider Note  ____________________________________________   [facility] Initiated Contact with Patient 04/11/17 1515     (approximate)  I have reviewed the triage vital signs and the nursing notes.   HISTORY  Chief Complaint Headache and Loss of Consciousness   HPI Margaret Lynch is a 52 y.o. female who comes to the emergency department via EMS after a syncopal episode at work.  She has been battling an upper respiratory tract infection for the past several days and today while sitting at her desk she had gradual onset not maximal onset bifrontal throbbing headache similar to previous sinus headaches and she began to feel lightheaded and "woozy".  She called a coworker over for help and shortly thereafter passed out briefly.  She had no seizure-like activity.  She woke up with persistent headache.  She has had no fevers or chills.  No neck pain.  No numbness or weakness.  She has passed out once before.  Her symptoms came on gradually and then were sudden.  They are now constant.  They are moderate severity.  Her headache is nonradiating.  Past Medical History:  Diagnosis Date  . Acid reflux   . HTN (hypertension)   . Nocturia   . Overweight   . Urinary frequency     Patient Active Problem List   Diagnosis Date Noted  . 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 Benign essential HTN 11/12/2014  . Dyspnea on exertion 11/06/2014  . H/O syncope 11/06/2014  . Intermittent palpitations 11/06/2014  . Urinary frequency 10/08/2014  . Nocturia 10/08/2014    History reviewed. No pertinent surgical history.  Prior to Admission medications   Medication Sig Start Date End Date Taking? Authorizing Provider  atenolol (TENORMIN) 25 MG tablet Take 1 tablet (25 mg total) by mouth at bedtime. 02/15/17  Yes Boscia, Kathlynn GrateHeather E, NP  losartan (COZAAR) 100 MG tablet Take 100 mg by mouth daily.   Yes [provider]  amoxicillin-clavulanate  (AUGMENTIN) 875-125 MG tablet Take 1 tablet by mouth 2 (two) times daily. Patient not taking: Reported on 04/11/2017 02/09/17   Lyndon CodeKhan, Fozia M, MD  butalbital-acetaminophen-caffeine [facility](FIORICET, [facility]) [phone] MG tablet Take 1-2 tablets by mouth every 6 (six) hours as needed for headache. 04/11/17 04/11/18  Merrily Brittleifenbark, Shontia Gillooly, MD  fluticasone (FLONASE) 50 MCG/ACT nasal spray Place 2 sprays into both nostrils daily.    [provider]    Allergies Isovue [iopamidol]  Family History  Problem Relation Age of Onset  . Hypertension Mother   . Diabetes Mother   . Esophageal cancer Father   . Kidney disease Neg Hx   . Prostate cancer Neg Hx   . Bladder Cancer Neg Hx   . Breast cancer Neg Hx     Social History Social History   Tobacco Use  . Smoking status: Never Smoker  . Smokeless tobacco: Never Used  Substance Use Topics  . Alcohol use: No    Alcohol/week: 0.0 oz  . Drug use: No    Review of Systems Constitutional: No fever/chills Eyes: No visual changes. ENT: No sore throat. Cardiovascular: Denies chest pain. Respiratory: Denies shortness of breath. Gastrointestinal: No abdominal pain.  No nausea, no vomiting.  No diarrhea.  No constipation. Genitourinary: Negative for dysuria. Musculoskeletal: Negative for back pain. Skin: Negative for rash. Neurological: Positive for headache.   ____________________________________________   PHYSICAL EXAM:  VITAL SIGNS: ED Triage Vitals  Enc Vitals Group     BP 04/11/17 1513 123/71  Pulse Rate 04/11/17 1513 82     Resp 04/11/17 1513 16     Temp 04/11/17 1513 98.7 F (37.1 C)     Temp Source 04/11/17 1513 Oral     SpO2 04/11/17 1509 96 %     Weight --      Height --      Head Circumference --      Peak Flow --      Pain Score --      Pain Loc --      Pain Edu? --      Excl. in GC? --     Constitutional: Alert and oriented x4 pleasant cooperative speaks with hoarse voice Eyes: PERRL EOMI. Head: Atraumatic. Nose:  No congestion/rhinnorhea. Mouth/Throat: No trismus uvula midline no pharyngeal erythema or exudate Neck: No stridor.   Cardiovascular: Normal rate, regular rhythm. Grossly normal heart sounds.  Good peripheral circulation. Respiratory: Normal respiratory effort.  No retractions. Lungs CTAB and moving good air Gastrointestinal: Soft nontender Musculoskeletal: No lower extremity edema   Neurologic:  Normal speech and language. No gross focal neurologic deficits are appreciated. Skin:  Skin is warm, dry and intact. No rash noted. Psychiatric: Mood and affect are normal. Speech and behavior are normal.    ____________________________________________   DIFFERENTIAL includes but not limited to  Subarachnoid hemorrhage, cardiogenic syncope, vasovagal syncope, dehydration ____________________________________________   LABS (all labs ordered are listed, but only abnormal results are displayed)  Labs Reviewed  GLUCOSE, CAPILLARY - Abnormal; Notable for the following components:      Result Value   Glucose-Capillary 126 (*)    All other components within normal limits  BASIC METABOLIC PANEL - Abnormal; Notable for the following components:   Sodium 134 (*)    Chloride 98 (*)    Glucose, Bld 144 (*)    All other components within normal limits  CBC  URINALYSIS, COMPLETE (UACMP) WITH MICROSCOPIC  CBG MONITORING, ED  POC URINE PREG, ED    Lab work reviewed by me with no acute disease __________________________________________  EKG  ED ECG REPORT I, Merrily Brittle, the attending physician, personally viewed and interpreted this ECG.  Date: 04/11/2017 EKG Time:  Rate: 88 Rhythm: normal sinus rhythm QRS Axis: normal Intervals: normal ST/T Wave abnormalities: Inferior T wave inversion consistent with previous EKG in 2016 Narrative Interpretation: no evidence of acute ischemia  ____________________________________________  RADIOLOGY  Head CT reviewed by me with no acute  disease ____________________________________________   PROCEDURES  Procedure(s) performed: no  Procedures  Critical Care performed: no  Observation: no ____________________________________________   INITIAL IMPRESSION / ASSESSMENT AND PLAN / ED COURSE  Pertinent labs & imaging results that were available during my care of the patient were reviewed by me and considered in my medical decision making (see chart for details).  Patient arrives with a normal blood sugar hemodynamically stable and well-appearing aside from her obvious laryngitis.  Head CT obtained given headache and syncope and fortunately is negative.  Her history is not exactly classic for subarachnoid hemorrhage and she is neuro intact.  No meningismus.  EKG with inferior T wave inversions which are consistent with old.  Normal intervals.  No blocks.  She was kept on monitor for over an hour with no ectopy.  I do lengthy discussion with the patient regarding her symptoms and they likely represent vasovagal syncope secondary to dehydration.  I have encouraged her to follow-up with her primary care physician and to return for any concerns.  She verbalized  understanding agree with plan.      ____________________________________________   FINAL CLINICAL IMPRESSION(S) / ED DIAGNOSES  Final diagnoses:  Syncope, unspecified syncope type  Nonintractable headache, unspecified chronicity pattern, unspecified headache type  Upper respiratory tract infection, unspecified type      NEW MEDICATIONS STARTED DURING THIS VISIT:  New Prescriptions   BUTALBITAL-ACETAMINOPHEN-CAFFEINE (FIORICET, ESGIC) 50-325-40 MG TABLET    Take 1-2 tablets by mouth every 6 (six) hours as needed for headache.     Note:  This document was prepared using Dragon voice recognition software and may include unintentional dictation errors.     Merrily Brittle, MD 04/11/17 1652

## 2017-04-11 NOTE — ED Triage Notes (Signed)
Pt to ED via [facility] from work for headache and syncope. Per EMS pt was at work and coworkers describe episode where pts eyes rolled back in her head and pt had a syncopal episode. Pt states that she woke up with headache this morning and was "feeling funny". Pt in NAD at this time.

## 2017-04-12 ENCOUNTER — Ambulatory Visit: Payer: Managed Care, Other (non HMO) | Admitting: Internal Medicine

## 2017-04-12 VITALS — BP 120/80 | HR 75 | Resp 16 | Ht 65.0 in | Wt 187.0 lb

## 2017-04-12 DIAGNOSIS — I952 Hypotension due to drugs: Secondary | ICD-10-CM

## 2017-04-12 DIAGNOSIS — J0100 Acute maxillary sinusitis, unspecified: Secondary | ICD-10-CM

## 2017-04-12 NOTE — Progress Notes (Signed)
[facility] [address] [place], [place] [address]  Internal MEDICINE  Office Visit Note  Patient Name: Margaret Lynch  [id number]  [id number]  Date of Service: 05/10/2017  Chief Complaint  Patient presents with  . Cough    low bp pressure    . Headache    going on  for few days   . Sinusitis    HPI  Pt is here for routine follow up. Pt has been sick for few days, her po intake has decreased but she kept taking her bp meds. Pt almost passed out at work    Current Medication: Outpatient Encounter Medications as of 04/12/2017  Medication Sig  . amoxicillin-clavulanate (AUGMENTIN) 875-125 MG tablet Take 1 tablet by mouth 2 (two) times daily. (Patient not taking: Reported on 04/11/2017)  . butalbital-acetaminophen-caffeine (FIORICET, ESGIC) 50-325-40 MG tablet Take 1-2 tablets by mouth every 6 (six) hours as needed for headache.  . [DISCONTINUED] atenolol (TENORMIN) 25 MG tablet Take 1 tablet (25 mg total) by mouth at bedtime.  . [DISCONTINUED] fluticasone (FLONASE) 50 MCG/ACT nasal spray Place 2 sprays into both nostrils daily.  . [DISCONTINUED] losartan (COZAAR) 100 MG tablet Take 100 mg by mouth daily.   No facility-administered encounter medications on file as of 04/12/2017.     Surgical History: No past surgical history on file.  Medical History: Past Medical History:  Diagnosis Date  . Acid reflux   . HTN (hypertension)   . Nocturia   . Overweight   . Urinary frequency     Family History: Family History  Problem Relation Age of Onset  . Hypertension Mother   . Diabetes Mother   . Esophageal cancer Father   . Kidney disease Neg Hx   . Prostate cancer Neg Hx   . Bladder Cancer Neg Hx   . Breast cancer Neg Hx     Social History   Socioeconomic History  . Marital status: Single    Spouse name: Not on file  . Number of children: Not on file  . Years of education: Not on file  . Highest education level: Not on file  Occupational History  .  Not on file  Social Needs  . Financial resource strain: Not on file  . Food insecurity:    Worry: Not on file    Inability: Not on file  . Transportation needs:    Medical: Not on file    Non-medical: Not on file  Tobacco Use  . Smoking status: Never Smoker  . Smokeless tobacco: Never Used  Substance and Sexual Activity  . Alcohol use: No    Alcohol/week: 0.0 oz  . Drug use: No  . Sexual activity: Not on file  Lifestyle  . Physical activity:    Days per week: Not on file    Minutes per session: Not on file  . Stress: Not on file  Relationships  . Social connections:    Talks on phone: Not on file    Gets together: Not on file    Attends religious service: Not on file    Active member of club or organization: Not on file    Attends meetings of clubs or organizations: Not on file    Relationship status: Not on file  . Intimate partner violence:    Fear of current or ex partner: Not on file    Emotionally abused: Not on file    Physically abused: Not on file    Forced sexual activity: Not on  file  Other Topics Concern  . Not on file  Social History Narrative  . Not on file    Review of Systems  Constitutional: Positive for fatigue. Negative for chills and unexpected weight change.  HENT: Positive for postnasal drip. Negative for congestion, rhinorrhea, sneezing and sore throat.   Eyes: Negative for redness.  Respiratory: Negative for cough, chest tightness and shortness of breath.   Cardiovascular: Positive for palpitations. Negative for chest pain.  Gastrointestinal: Negative for abdominal pain, constipation, diarrhea, nausea and vomiting.  Genitourinary: Negative for dysuria and frequency.  Musculoskeletal: Negative for arthralgias, back pain, joint swelling and neck pain.  Skin: Negative for rash.  Neurological: Negative.  Negative for tremors and numbness.  Hematological: Negative for adenopathy. Does not bruise/bleed easily.  Psychiatric/Behavioral: Negative for  behavioral problems (Depression), sleep disturbance and suicidal ideas. The patient is not nervous/anxious.     Vital Signs: BP 120/80 (BP Location: Right Arm, Cuff Size: Normal)   Pulse 75   Resp 16   Ht 5\' 5"  (1.651 m)   Wt 187 lb (84.8 kg)   SpO2 97%   BMI 31.12 kg/m    Physical Exam  Constitutional: She is oriented to person, place, and time. She appears well-developed and well-nourished. No distress.  HENT:  Head: Normocephalic and atraumatic.  Mouth/Throat: Oropharynx is clear and moist. No oropharyngeal exudate.  Eyes: Pupils are equal, round, and reactive to light. EOM are normal.  Neck: Normal range of motion. Neck supple. No JVD present. No tracheal deviation present. No thyromegaly present.  Cardiovascular: Normal rate, regular rhythm and normal heart sounds. Exam reveals no gallop and no friction rub.  No murmur heard. Pulmonary/Chest: Effort normal. No respiratory distress. She has no wheezes. She has no rales. She exhibits no tenderness.  Abdominal: Soft. Bowel sounds are normal.  Musculoskeletal: Normal range of motion.  Lymphadenopathy:    She has no cervical adenopathy.  Neurological: She is alert and oriented to person, place, and time. No cranial nerve deficit.  Skin: Skin is warm and dry. She is not diaphoretic.  Psychiatric: She has a normal mood and affect. Her behavior is normal. Judgment and thought content normal.   Assessment/Plan: 1. Hypotension due to drugs - Hold Tenormin and cozar for now, monitor bp at home   2. Acute non-recurrent maxillary sinusitis - Finish abx as prescribed.   General Counseling: Letitia NeriLinda verbalizes understanding of the findings of todays visit and agrees with plan of treatment. I have discussed any further diagnostic evaluation that may be needed or ordered today. We also reviewed her medications today. she has been encouraged to call the office with any questions or concerns that should arise related to todays visit.   Time  spent:20 Minutes  Dr Lyndon CodeFozia M Khan Internal medicine

## 2017-04-18 ENCOUNTER — Other Ambulatory Visit: Payer: Self-pay

## 2017-04-18 MED ORDER — LOSARTAN POTASSIUM 100 MG PO TABS: 100.0000 mg | ORAL_TABLET | Freq: Every day | ORAL | 1 refills | Status: DC

## 2017-04-18 MED ORDER — FLUTICASONE PROPIONATE 50 MCG/ACT NA SUSP: 2.0000 | Freq: Every day | NASAL | 1 refills | Status: DC

## 2017-04-21 ENCOUNTER — Encounter: Payer: Self-pay | Admitting: Nurse Practitioner

## 2017-04-21 ENCOUNTER — Ambulatory Visit: Payer: Managed Care, Other (non HMO) | Admitting: Nurse Practitioner

## 2017-04-21 VITALS — BP 150/98 | HR 92 | Resp 16 | Ht 64.0 in | Wt 196.0 lb

## 2017-04-21 DIAGNOSIS — J3489 Other specified disorders of nose and nasal sinuses: Secondary | ICD-10-CM

## 2017-04-21 DIAGNOSIS — I10 Essential (primary) hypertension: Secondary | ICD-10-CM

## 2017-04-21 DIAGNOSIS — J3481 Nasal mucositis (ulcerative): Secondary | ICD-10-CM | POA: Insufficient documentation

## 2017-04-21 MED ORDER — ATENOLOL 25 MG PO TABS: 25.0000 mg | ORAL_TABLET | Freq: Every day | ORAL | 1 refills | Status: DC

## 2017-04-21 MED ORDER — LOSARTAN POTASSIUM 100 MG PO TABS: 100.0000 mg | ORAL_TABLET | Freq: Every day | ORAL | 1 refills | Status: DC

## 2017-04-21 MED ORDER — FLUTICASONE PROPIONATE 50 MCG/ACT NA SUSP
2.0000 | Freq: Every day | NASAL | 3 refills | Status: DC
Start: 2017-04-21 — End: 2020-12-03

## 2017-04-21 NOTE — Progress Notes (Signed)
[facility] [address] [place], [place] [id number]  Internal MEDICINE  Office Visit Note  Patient Name: Margaret FlamingLinda C Lynch  [id number]/18/[id number]  [id number]  Date of Service: 04/21/2017  Chief Complaint  Patient presents with  . Hypertension  . Headache  . Sleeping Problem     The patient is here for sick visit. She had episode last Monday, when she passed out at work. Was taken to ER due to hypotension. She had been fighting off a cold and was not eating or drinking well. The ER had her cut daytime bp medication in half. At ER follow up, blood pressure was still very low, so night time BP medication was also stopped.  She is now feeling mostly better. Eating and drinking better. Blood pressure is elevated. She is having headaches and is not sleeping well at night. This has been especially bad in past four nights.   Pt is here for a sick visit.     Current Medication:  Outpatient Encounter Medications as of 04/21/2017  Medication Sig  . amoxicillin-clavulanate (AUGMENTIN) 875-125 MG tablet Take 1 tablet by mouth 2 (two) times daily. (Patient not taking: Reported on 04/11/2017)  . atenolol (TENORMIN) 25 MG tablet Take 1 tablet (25 mg total) by mouth at bedtime.  . butalbital-acetaminophen-caffeine (FIORICET, ESGIC) 50-325-40 MG tablet Take 1-2 tablets by mouth every 6 (six) hours as needed for headache.  . fluticasone (FLONASE) 50 MCG/ACT nasal spray Place 2 sprays into both nostrils daily.  Marland Kitchen. losartan (COZAAR) 100 MG tablet Take 1 tablet (100 mg total) by mouth daily.  . [DISCONTINUED] atenolol (TENORMIN) 25 MG tablet Take 1 tablet (25 mg total) by mouth at bedtime.  . [DISCONTINUED] fluticasone (FLONASE) 50 MCG/ACT nasal spray Place 2 sprays into both nostrils daily.  . [DISCONTINUED] losartan (COZAAR) 100 MG tablet Take 1 tablet (100 mg total) by mouth daily.   No facility-administered encounter medications on file as of 04/21/2017.       Medical History: Past Medical  History:  Diagnosis Date  . Acid reflux   . HTN (hypertension)   . Nocturia   . Overweight   . Urinary frequency      Vital Signs: BP (!) 150/98 (BP Location: Left Arm, Patient Position: Sitting, Cuff Size: Normal)   Pulse 92   Resp 16   Ht 5\' 4"  (1.626 m)   Wt 196 lb (88.9 kg)   SpO2 99%   BMI 33.64 kg/m    Review of Systems  Constitutional: Negative for activity change, appetite change, chills, fatigue, fever and unexpected weight change.  HENT: Positive for congestion and postnasal drip. Negative for ear pain, rhinorrhea, sinus pressure, sinus pain, sore throat, tinnitus and trouble swallowing.   Eyes: Negative.  Negative for photophobia, discharge, redness, itching and visual disturbance.  Respiratory: Negative for apnea, cough, choking, chest tightness, shortness of breath and wheezing.   Cardiovascular: Negative for chest pain, palpitations and leg swelling.       Elevated blood pressure   Gastrointestinal: Negative for abdominal distention, blood in stool, constipation, diarrhea, nausea and vomiting.  Endocrine: Negative for cold intolerance, heat intolerance, polydipsia, polyphagia and polyuria.  Genitourinary: Negative for difficulty urinating, dyspareunia, dysuria, flank pain, frequency, menstrual problem, pelvic pain and vaginal bleeding.  Musculoskeletal: Negative for arthralgias, back pain, gait problem, joint swelling, myalgias and neck pain.  Skin: Negative for rash.  Allergic/Immunologic: Positive for environmental allergies. Negative for food allergies.  Neurological: Positive for headaches. Negative for dizziness, tremors, syncope and weakness.  Hematological: Negative for adenopathy. Does not bruise/bleed easily.  Psychiatric/Behavioral: Negative for agitation, dysphoric mood, hallucinations, self-injury and suicidal ideas. The patient is not nervous/anxious.     Physical Exam  Constitutional: She is oriented to person, place, and time. She appears  well-developed and well-nourished. No distress.  HENT:  Head: Normocephalic and atraumatic.  Mouth/Throat: Oropharynx is clear and moist. No oropharyngeal exudate.  Eyes: Pupils are equal, round, and reactive to light. EOM are normal.  Neck: Normal range of motion. Neck supple. No JVD present. No tracheal deviation present. No thyromegaly present.  Cardiovascular: Normal rate, regular rhythm and normal heart sounds. Exam reveals no gallop and no friction rub.  No murmur heard. Pulmonary/Chest: Effort normal. No respiratory distress. She has no wheezes. She has no rales. She exhibits no tenderness.  Abdominal: Soft. Bowel sounds are normal.  Musculoskeletal: Normal range of motion.  Lymphadenopathy:    She has no cervical adenopathy.  Neurological: She is alert and oriented to person, place, and time. No cranial nerve deficit.  Skin: Skin is warm and dry. She is not diaphoretic.  Psychiatric: She has a normal mood and affect. Her behavior is normal. Judgment and thought content normal.  Nursing note and vitals reviewed.  Assessment/Plan: 1. Benign essential HTN Blood pressure elevated today nowthat URI is subsiding. Restart atenolol 25mg  QHS. Continue losartan 100mg , taking 12 tablet during the day. Advised the patient to monitor blood pressure at home. Will increase losartan as needed and as indicated.  - atenolol (TENORMIN) 25 MG tablet; Take 1 tablet (25 mg total) by mouth at bedtime.  Dispense: 90 tablet; Refill: 1 - losartan (COZAAR) 100 MG tablet; Take 1 tablet (100 mg total) by mouth daily.  Dispense: 90 tablet; Refill: 1  2. Non-ulcerative nasal mucositis - fluticasone (FLONASE) 50 MCG/ACT nasal spray; Place 2 sprays into both nostrils daily.  Dispense: 48 g; Refill: 3  General Counseling: [name] verbalizes understanding of the findings of todays visit and agrees with plan of treatment. I have discussed any further diagnostic evaluation that may be needed or ordered today. We also  reviewed her medications today. she has been encouraged to call the office with any questions or concerns that should arise related to todays visit.  This patient was seen by Vincent Gros, FNP- C in Collaboration with Dr Lyndon Code as a part of collaborative care agreement   Meds ordered this encounter  Medications  . atenolol (TENORMIN) 25 MG tablet    Sig: Take 1 tablet (25 mg total) by mouth at bedtime.    Dispense:  90 tablet    Refill:  1    Order Specific Question:   Supervising Provider    Answer:   Lyndon Code [[id number]]  . fluticasone (FLONASE) 50 MCG/ACT nasal spray    Sig: Place 2 sprays into both nostrils daily.    Dispense:  48 g    Refill:  3    Order Specific Question:   Supervising Provider    Answer:   Lyndon Code [[id number]]  . losartan (COZAAR) 100 MG tablet    Sig: Take 1 tablet (100 mg total) by mouth daily.    Dispense:  90 tablet    Refill:  1    Order Specific Question:   Supervising Provider    Answer:   Lyndon Code [[id number]]    Time spent: 15 Minutes

## 2017-04-22 ENCOUNTER — Telehealth: Payer: Self-pay

## 2017-04-22 NOTE — Telephone Encounter (Signed)
Patient has been advised to bring her FMLA papers instead of faxing she needs to drop off the forms./ BR

## 2017-04-25 ENCOUNTER — Other Ambulatory Visit: Payer: Self-pay | Admitting: Nurse Practitioner

## 2017-04-25 ENCOUNTER — Telehealth: Payer: Self-pay

## 2017-04-25 ENCOUNTER — Other Ambulatory Visit: Payer: Self-pay

## 2017-04-25 DIAGNOSIS — F5101 Primary insomnia: Secondary | ICD-10-CM

## 2017-04-25 MED ORDER — TRAZODONE HCL 50 MG PO TABS: 25.0000 mg | ORAL_TABLET | Freq: Every evening | ORAL | 3 refills | Status: DC | PRN

## 2017-04-25 NOTE — Progress Notes (Signed)
Patient called office, c/o difficulty sleeping. Was seen 04/21/2017. Added trazodone, taking 25-50mg at bedtime if needed. New prescription sent to [facility].

## 2017-04-25 NOTE — Telephone Encounter (Signed)
SEND MESSAGE TO HEATHER  

## 2017-04-25 NOTE — Telephone Encounter (Signed)
Patient called office, c/o difficulty sleeping. Was seen 04/21/2017. Added trazodone, taking 25-50mg  at bedtime if needed. New prescription sent to [facility].

## 2017-04-27 ENCOUNTER — Telehealth: Payer: Self-pay | Admitting: Internal Medicine

## 2017-04-27 NOTE — Telephone Encounter (Signed)
Patient thinks her BP if abnormal I advised patient to check her BP now and tonight and again in the morning and give us a call with the numbers/BR

## 2017-04-28 ENCOUNTER — Telehealth: Payer: Self-pay

## 2017-04-28 NOTE — Telephone Encounter (Signed)
[facility]

## 2017-04-28 NOTE — Telephone Encounter (Signed)
I think these pressure readings look ok. Please have her continue to monitor.

## 2017-04-29 ENCOUNTER — Telehealth: Payer: Self-pay

## 2017-04-29 NOTE — Telephone Encounter (Signed)
Left message for pt that Bp readings were ok and to continue to monitor blood pressure.  dbs

## 2017-05-16 ENCOUNTER — Telehealth: Payer: Self-pay | Admitting: Internal Medicine

## 2017-05-16 NOTE — Telephone Encounter (Signed)
PATIENT CALLED AND TOLD THAT HER FML2 PAPERWORK WAS READY FOR PICK UP.JW

## 2017-06-07 ENCOUNTER — Ambulatory Visit (INDEPENDENT_AMBULATORY_CARE_PROVIDER_SITE_OTHER): Payer: Managed Care, Other (non HMO) | Admitting: Nurse Practitioner

## 2017-06-07 ENCOUNTER — Encounter: Payer: Self-pay | Admitting: Nurse Practitioner

## 2017-06-07 VITALS — BP 154/72 | HR 60 | Resp 16 | Ht 64.0 in | Wt 196.4 lb

## 2017-06-07 DIAGNOSIS — E668 Other obesity: Secondary | ICD-10-CM | POA: Insufficient documentation

## 2017-06-07 DIAGNOSIS — I10 Essential (primary) hypertension: Secondary | ICD-10-CM | POA: Diagnosis not present

## 2017-06-07 MED ORDER — ATENOLOL 25 MG PO TABS: 25.0000 mg | ORAL_TABLET | Freq: Every day | ORAL | 4 refills | Status: DC

## 2017-06-07 MED ORDER — LOSARTAN POTASSIUM 100 MG PO TABS
50.0000 mg | ORAL_TABLET | Freq: Every day | ORAL | 4 refills | Status: DC
Start: 2017-06-07 — End: 2018-02-27

## 2017-06-07 NOTE — Progress Notes (Signed)
[facility] [address] [place], [place] [address]  Internal MEDICINE  Office Visit Note  Patient Name: Margaret Lynch  [id number]  [id number]  Date of Service: 06/29/2017    Pt is here for routine follow up.   Chief Complaint  Patient presents with  . Hypertension    Patient's blood pressure starting to climp. Medications had been held after having syncopal event. She was sent to ER and blood pressure had been rather low. When seen in the office, blood pressure was still low, but had been starting to return to normal. Still held medications due to unusually low pressure for patient. Today, blood pressure elvated. She is having headaches and she is finding it difficult to sleep. No chest pain, palitations, or SOB are reported.      Current Medication: Outpatient Encounter Medications as of 06/07/2017  Medication Sig  . atenolol (TENORMIN) 25 MG tablet Take 1 tablet (25 mg total) by mouth at bedtime.  . fluticasone (FLONASE) 50 MCG/ACT nasal spray Place 2 sprays into both nostrils daily.  Marland Kitchen losartan (COZAAR) 100 MG tablet Take 0.5 tablets (50 mg total) by mouth daily.  . [DISCONTINUED] atenolol (TENORMIN) 25 MG tablet Take 1 tablet (25 mg total) by mouth at bedtime.  . [DISCONTINUED] losartan (COZAAR) 100 MG tablet Take 1 tablet (100 mg total) by mouth daily. (Patient taking differently: Take 100 mg by mouth daily. )  . butalbital-acetaminophen-caffeine (FIORICET, ESGIC) 50-325-40 MG tablet Take 1-2 tablets by mouth every 6 (six) hours as needed for headache. (Patient not taking: Reported on 06/07/2017)  . traZODone (DESYREL) 50 MG tablet Take 0.5-1 tablets (25-50 mg total) by mouth at bedtime as needed for sleep. (Patient not taking: Reported on 06/07/2017)  . [DISCONTINUED] amoxicillin-clavulanate (AUGMENTIN) 875-125 MG tablet Take 1 tablet by mouth 2 (two) times daily. (Patient not taking: Reported on 04/11/2017)   No facility-administered encounter medications on file  as of 06/07/2017.     Surgical History: No past surgical history on file.  Medical History: Past Medical History:  Diagnosis Date  . Acid reflux   . HTN (hypertension)   . Nocturia   . Overweight   . Urinary frequency     Family History: Family History  Problem Relation Age of Onset  . Hypertension Mother   . Diabetes Mother   . Esophageal cancer Father   . Kidney disease Neg Hx   . Prostate cancer Neg Hx   . Bladder Cancer Neg Hx   . Breast cancer Neg Hx     Social History   Socioeconomic History  . Marital status: Single    Spouse name: Not on file  . Number of children: Not on file  . Years of education: Not on file  . Highest education level: Not on file  Occupational History  . Not on file  Social Needs  . Financial resource strain: Not on file  . Food insecurity:    Worry: Not on file    Inability: Not on file  . Transportation needs:    Medical: Not on file    Non-medical: Not on file  Tobacco Use  . Smoking status: Never Smoker  . Smokeless tobacco: Never Used  Substance and Sexual Activity  . Alcohol use: No    Alcohol/week: 0.0 oz  . Drug use: No  . Sexual activity: Not on file  Lifestyle  . Physical activity:    Days per week: Not on file    Minutes per session: Not on  file  . Stress: Not on file  Relationships  . Social connections:    Talks on phone: Not on file    Gets together: Not on file    Attends religious service: Not on file    Active member of club or organization: Not on file    Attends meetings of clubs or organizations: Not on file    Relationship status: Not on file  . Intimate partner violence:    Fear of current or ex partner: Not on file    Emotionally abused: Not on file    Physically abused: Not on file    Forced sexual activity: Not on file  Other Topics Concern  . Not on file  Social History Narrative  . Not on file      Review of Systems  Constitutional: Positive for fatigue. Negative for activity change,  appetite change, chills, fever and unexpected weight change.  HENT: Negative for ear pain, postnasal drip, rhinorrhea, sinus pressure, sinus pain, sore throat, tinnitus and trouble swallowing.   Eyes: Negative.  Negative for photophobia, discharge, redness, itching and visual disturbance.  Respiratory: Negative for apnea, cough, choking, chest tightness, shortness of breath and wheezing.   Cardiovascular: Negative for chest pain, palpitations and leg swelling.       Elevated blood pressure   Gastrointestinal: Negative for abdominal distention, blood in stool, constipation, diarrhea, nausea and vomiting.  Endocrine: Negative for cold intolerance, heat intolerance, polydipsia, polyphagia and polyuria.  Genitourinary: Negative for difficulty urinating, dyspareunia, dysuria, flank pain, frequency, menstrual problem, pelvic pain and vaginal bleeding.  Musculoskeletal: Negative for arthralgias, back pain, gait problem, joint swelling, myalgias and neck pain.  Skin: Negative for rash.  Allergic/Immunologic: Positive for environmental allergies. Negative for food allergies.  Neurological: Positive for headaches. Negative for dizziness, tremors, syncope and weakness.  Hematological: Negative for adenopathy. Does not bruise/bleed easily.  Psychiatric/Behavioral: Negative for agitation, dysphoric mood, hallucinations, self-injury and suicidal ideas. The patient is not nervous/anxious.     Today's Vitals   06/07/17 1456  BP: (!) 154/72  Pulse: 60  Resp: 16  SpO2: 97%  Weight: 196 lb 6.4 oz (89.1 kg)  Height:  (1.626 m)    Physical Exam  Constitutional: She is oriented to person, place, and time. She appears well-developed and well-nourished. No distress.  HENT:  Head: Normocephalic and atraumatic.  Mouth/Throat: Oropharynx is clear and moist. No oropharyngeal exudate.  Eyes: Pupils are equal, round, and reactive to light. EOM are normal.  Neck: Normal range of motion. Neck supple. No JVD  present. No tracheal deviation present. No thyromegaly present.  Cardiovascular: Normal rate, regular rhythm and normal heart sounds. Exam reveals no gallop and no friction rub.  No murmur heard. Pulmonary/Chest: Effort normal. No respiratory distress. She has no wheezes. She has no rales. She exhibits no tenderness.  Abdominal: Soft. Bowel sounds are normal.  Musculoskeletal: Normal range of motion.  Lymphadenopathy:    She has no cervical adenopathy.  Neurological: She is alert and oriented to person, place, and time. No cranial nerve deficit.  Skin: Skin is warm and dry. She is not diaphoretic.  Psychiatric: She has a normal mood and affect. Her behavior is normal. Judgment and thought content normal.  Nursing note and vitals reviewed.  Assessment/Plan:  1. Benign essential HTN Restart atenolol  QD at bedtime. Add back losartan at  daily. Advised her to closely monitor blood pressure and notify office if blood pressure continuing to run high.  - atenolol (TENORMIN) 25  MG tablet; Take 1 tablet (25 mg total) by mouth at bedtime.  Dispense: 90 tablet; Refill: 4 - losartan (COZAAR) 100 MG tablet; Take 0.5 tablets (50 mg total) by mouth daily.  Dispense: 90 tablet; Refill: 4  2. Moderate obesity Discussed diet and lifestyle changes needed to help with weight loss.   General Counseling: jeanice dempsey understanding of the findings of todays visit and agrees with plan of treatment. I have discussed any further diagnostic evaluation that may be needed or ordered today. We also reviewed her medications today. she has been encouraged to call the office with any questions or concerns that should arise related to todays visit.   Hypertension Counseling:   The following hypertensive lifestyle modification were recommended and discussed:  1. Limiting alcohol intake to less than 1 oz/day of ethanol:(24 oz of beer or 8 oz of wine or 2 oz of 100-proof whiskey). 2. Take baby ASA 81 mg  daily. 3. Importance of regular aerobic exercise and losing weight. 4. Reduce dietary saturated fat and cholesterol intake for overall cardiovascular health. 5. Maintaining adequate dietary potassium, calcium, and magnesium intake. 6. Regular monitoring of the blood pressure. 7. Reduce sodium intake to less than 100 mmol/day (less than 2.3 gm of sodium or less than 6 gm of sodium choride)   This patient was seen by Vincent Gros, FNP- C in Collaboration with Dr Lyndon Code as a part of collaborative care agreement  Meds ordered this encounter  Medications  . atenolol (TENORMIN) 25 MG tablet    Sig: Take 1 tablet (25 mg total) by mouth at bedtime.    Dispense:  90 tablet    Refill:  4    Order Specific Question:   Supervising Provider    Answer:   Lyndon Code [[id number]]  . losartan (COZAAR) 100 MG tablet    Sig: Take 0.5 tablets (50 mg total) by mouth daily.    Dispense:  90 tablet    Refill:  4    Order Specific Question:   Supervising Provider    Answer:   Lyndon Code [[id number]]    Time spent: 13 Minutes          Dr Lyndon Code Internal medicine

## 2017-07-15 ENCOUNTER — Other Ambulatory Visit: Payer: Self-pay

## 2017-07-15 DIAGNOSIS — I10 Essential (primary) hypertension: Secondary | ICD-10-CM

## 2017-07-15 MED ORDER — ATENOLOL 25 MG PO TABS
25.0000 mg | ORAL_TABLET | Freq: Every day | ORAL | 3 refills | Status: DC
Start: 2017-07-15 — End: 2018-05-02

## 2017-11-08 ENCOUNTER — Telehealth: Payer: Self-pay | Admitting: Nurse Practitioner

## 2017-11-13 NOTE — Telephone Encounter (Signed)
Done and put on your desk this morning.

## 2017-12-20 ENCOUNTER — Ambulatory Visit (INDEPENDENT_AMBULATORY_CARE_PROVIDER_SITE_OTHER): Payer: Managed Care, Other (non HMO) | Admitting: Nurse Practitioner

## 2017-12-20 ENCOUNTER — Encounter: Payer: Self-pay | Admitting: Nurse Practitioner

## 2017-12-20 VITALS — BP 150/88 | HR 80 | Resp 16 | Ht 64.0 in | Wt 202.0 lb

## 2017-12-20 DIAGNOSIS — Z1239 Encounter for other screening for malignant neoplasm of breast: Secondary | ICD-10-CM

## 2017-12-20 DIAGNOSIS — I10 Essential (primary) hypertension: Secondary | ICD-10-CM

## 2017-12-20 DIAGNOSIS — Z0001 Encounter for general adult medical examination with abnormal findings: Secondary | ICD-10-CM | POA: Diagnosis not present

## 2017-12-20 DIAGNOSIS — R635 Abnormal weight gain: Secondary | ICD-10-CM | POA: Diagnosis not present

## 2017-12-20 DIAGNOSIS — K219 Gastro-esophageal reflux disease without esophagitis: Secondary | ICD-10-CM

## 2017-12-20 DIAGNOSIS — R300 Dysuria: Secondary | ICD-10-CM

## 2017-12-20 MED ORDER — OMEPRAZOLE 40 MG PO CPDR: 40.0000 mg | DELAYED_RELEASE_CAPSULE | Freq: Every day | ORAL | 5 refills | Status: DC

## 2017-12-20 NOTE — Progress Notes (Signed)
[facility] [address] [place], [place] [address]  Internal MEDICINE  Office Visit Note  Patient Name: Margaret Lynch  [id number]  [id number]  Date of Service: 12/24/2017   Pt is here for routine health maintenance examination   Chief Complaint  Patient presents with  . Annual Exam    6 month cpe  . Hypertension  . Quality Metric Gaps    pt will get flu vaccine from pharmacy     Hypertension  This is a chronic problem. The current episode started more than 1 year ago. The problem is unchanged. The problem is controlled. Associated symptoms include headaches. Pertinent negatives include no chest pain, neck pain, palpitations or shortness of breath. There are no associated agents to hypertension. Risk factors for coronary artery disease include obesity and family history. Past treatments include beta blockers. The current treatment provides moderate improvement. Compliance problems include exercise.     Current Medication: Outpatient Encounter Medications as of 12/20/2017  Medication Sig  . atenolol (TENORMIN) 25 MG tablet Take 1 tablet (25 mg total) by mouth at bedtime.  . fluticasone (FLONASE) 50 MCG/ACT nasal spray Place 2 sprays into both nostrils daily.  Marland Kitchen. losartan (COZAAR) 100 MG tablet Take 0.5 tablets (50 mg total) by mouth daily.  Marland Kitchen. omeprazole (PRILOSEC) 40 MG capsule Take 1 capsule (40 mg total) by mouth daily.  . [DISCONTINUED] omeprazole (PRILOSEC) 40 MG capsule Take 1 capsule by mouth daily.  . butalbital-acetaminophen-caffeine (FIORICET, ESGIC) 50-325-40 MG tablet Take 1-2 tablets by mouth every 6 (six) hours as needed for headache. (Patient not taking: Reported on 06/07/2017)  . traZODone (DESYREL) 50 MG tablet Take 0.5-1 tablets (25-50 mg total) by mouth at bedtime as needed for sleep. (Patient not taking: Reported on 06/07/2017)   No facility-administered encounter medications on file as of 12/20/2017.     Surgical History: History reviewed. No  pertinent surgical history.  Medical History: Past Medical History:  Diagnosis Date  . Acid reflux   . HTN (hypertension)   . Nocturia   . Overweight   . Urinary frequency     Family History: Family History  Problem Relation Age of Onset  . Hypertension Mother   . Diabetes Mother   . Esophageal cancer Father   . Kidney disease Neg Hx   . Prostate cancer Neg Hx   . Bladder Cancer Neg Hx   . Breast cancer Neg Hx       Review of Systems  Constitutional: Negative for activity change, appetite change, chills, fatigue, fever and unexpected weight change.  HENT: Negative for ear pain, postnasal drip, rhinorrhea, sinus pressure, sinus pain, sore throat, tinnitus and trouble swallowing.   Eyes: Negative.  Negative for photophobia, discharge, redness, itching and visual disturbance.  Respiratory: Negative for apnea, cough, choking, chest tightness, shortness of breath and wheezing.   Cardiovascular: Negative for chest pain, palpitations and leg swelling.       Elevated blood pressure   Gastrointestinal: Negative for abdominal distention, blood in stool, constipation, diarrhea, nausea and vomiting.       The patient states that she has experienced some acid reflux type symptoms. States that spicy foods and eating late at night make this more severe. Taking rolaids or tums does help the symptoms.   Endocrine: Negative for cold intolerance, heat intolerance, polydipsia, polyphagia and polyuria.  Genitourinary: Negative for difficulty urinating, dyspareunia, dysuria, flank pain, frequency, menstrual problem, pelvic pain and vaginal bleeding.  Musculoskeletal: Negative for arthralgias, back pain, gait problem,  joint swelling, myalgias and neck pain.  Skin: Negative for rash.  Allergic/Immunologic: Positive for environmental allergies. Negative for food allergies.  Neurological: Positive for headaches. Negative for dizziness, tremors, syncope and weakness.  Hematological: Negative for  adenopathy. Does not bruise/bleed easily.  Psychiatric/Behavioral: Negative for agitation, dysphoric mood, hallucinations, self-injury, sleep disturbance and suicidal ideas. The patient is not nervous/anxious.      Today's Vitals   12/20/17 1438  BP: (!) 150/88  Pulse: 80  Resp: 16  SpO2: 98%  Weight: 202 lb (91.6 kg)  Height: 5\' 4"  (1.626 m)    Physical Exam  Constitutional: She is oriented to person, place, and time. She appears well-developed and well-nourished. No distress.  HENT:  Head: Normocephalic and atraumatic.  Nose: Nose normal.  Mouth/Throat: No oropharyngeal exudate.  Eyes: Pupils are equal, round, and reactive to light. Conjunctivae and EOM are normal.  Neck: Normal range of motion. Neck supple. No JVD present. Carotid bruit is not present. No tracheal deviation present. No thyromegaly present.  Cardiovascular: Normal rate, regular rhythm, normal heart sounds and intact distal pulses. Exam reveals no gallop and no friction rub.  No murmur heard. Pulmonary/Chest: Effort normal and breath sounds normal. No respiratory distress. She has no wheezes. She has no rales. She exhibits no tenderness. Right breast exhibits no inverted nipple, no mass, no nipple discharge, no skin change and no tenderness. Left breast exhibits no inverted nipple, no mass, no nipple discharge, no skin change and no tenderness.  Abdominal: Soft. Bowel sounds are normal. There is no tenderness.  Musculoskeletal: Normal range of motion.  Lymphadenopathy:    She has no cervical adenopathy.  Neurological: She is alert and oriented to person, place, and time. No cranial nerve deficit.  Skin: Skin is warm and dry. Capillary refill takes less than 2 seconds. She is not diaphoretic.  Psychiatric: She has a normal mood and affect. Her behavior is normal. Judgment and thought content normal.  Nursing note and vitals reviewed.    LABS: Recent Results (from the past 2160 hour(s))  UA/M w/rflx Culture,  Routine     Status: Abnormal (Preliminary result)   Collection Time: 12/20/17  2:39 PM  Result Value Ref Range   Specific Gravity, UA      >=1.030 (A) 1.005 - 1.030   pH, UA 6.5 5.0 - 7.5   Color, UA Yellow Yellow   Appearance Ur Clear Clear   Leukocytes, UA Negative Negative   Protein, UA Negative Negative/Trace   Glucose, UA Negative Negative   Ketones, UA Negative Negative   RBC, UA Negative Negative   Bilirubin, UA Negative Negative   Urobilinogen, Ur 0.2 0.2 - 1.0 mg/dL   Nitrite, UA Negative Negative   Microscopic Examination Comment     Comment: Microscopic follows if indicated.   Microscopic Examination See below:     Comment: Microscopic was indicated and was performed.   Urinalysis Reflex Comment     Comment: This specimen has reflexed to a Urine Culture.  Microscopic Examination     Status: Abnormal   Collection Time: 12/20/17  2:39 PM  Result Value Ref Range   WBC, UA 0-5 0 - 5 /hpf   RBC, UA 0-2 0 - 2 /hpf   Epithelial Cells (non renal) 0-10 0 - 10 /hpf   Casts None seen None seen /lpf   Crystals Present (A) N/A   Crystal Type Amorphous Sediment N/A    Comment: Calcium Oxalate   Mucus, UA Present Not Estab.  Bacteria, UA Many (A) None seen/Few  Urine Culture, Reflex     Status: None (Preliminary result)   Collection Time: 12/20/17  2:39 PM  Result Value Ref Range   Urine Culture, Routine WILL FOLLOW    Assessment/Plan: 1. Encounter for general adult medical examination with abnormal findings Annual health maintenance exam today  2. Gastroesophageal reflux disease without esophagitis Will add omeprazole 40mg  daily. Advised she avoid trigger foods and eating late at night. Encouraged her to sleep with head of bed raised up at 30degrees. Reassess at next visit.  - omeprazole (PRILOSEC) 40 MG capsule; Take 1 capsule (40 mg total) by mouth daily.  Dispense: 30 capsule; Refill: 5  3. Benign essential HTN Stable. Continue bp medication as prescribed  4.  Abnormal weight gain Check routine labs, including thyroid panel for further evaluation.   5. Screening for breast cancer - MM DIGITAL SCREENING BILATERAL; Future  6. Dysuria - UA/M w/rflx Culture, Routine  General Counseling: [name] understanding of the findings of todays visit and agrees with plan of treatment. I have discussed any further diagnostic evaluation that may be needed or ordered today. We also reviewed her medications today. she has been encouraged to call the office with any questions or concerns that should arise related to todays visit.    Counseling:  Hypertension Counseling:   The following hypertensive lifestyle modification were recommended and discussed:  1. Limiting alcohol intake to less than 1 oz/day of ethanol:(24 oz of beer or 8 oz of wine or 2 oz of 100-proof whiskey). 2. Take baby ASA 81 mg daily. 3. Importance of regular aerobic exercise and losing weight. 4. Reduce dietary saturated fat and cholesterol intake for overall cardiovascular health. 5. Maintaining adequate dietary potassium, calcium, and magnesium intake. 6. Regular monitoring of the blood pressure. 7. Reduce sodium intake to less than 100 mmol/day (less than 2.3 gm of sodium or less than 6 gm of sodium choride)   This patient was seen by Vincent Gros FNP Collaboration with Dr Lyndon Code as a part of collaborative care agreement  Orders Placed This Encounter  Procedures  . Microscopic Examination  . Urine Culture, Reflex  . MM DIGITAL SCREENING BILATERAL  . UA/M w/rflx Culture, Routine    Meds ordered this encounter  Medications  . omeprazole (PRILOSEC) 40 MG capsule    Sig: Take 1 capsule (40 mg total) by mouth daily.    Dispense:  30 capsule    Refill:  5    Order Specific Question:   Supervising Provider    Answer:   Lyndon Code [[id number]]    Time spent: 47 Minutes      Lyndon Code, MD  Internal Medicine

## 2017-12-24 DIAGNOSIS — R300 Dysuria: Secondary | ICD-10-CM | POA: Insufficient documentation

## 2017-12-24 DIAGNOSIS — R635 Abnormal weight gain: Secondary | ICD-10-CM | POA: Insufficient documentation

## 2017-12-24 DIAGNOSIS — Z1239 Encounter for other screening for malignant neoplasm of breast: Secondary | ICD-10-CM | POA: Insufficient documentation

## 2017-12-24 DIAGNOSIS — K219 Gastro-esophageal reflux disease without esophagitis: Secondary | ICD-10-CM | POA: Insufficient documentation

## 2017-12-24 LAB — UA/M W/RFLX CULTURE, ROUTINE
BILIRUBIN UA: NEGATIVE
GLUCOSE, UA: NEGATIVE
KETONES UA: NEGATIVE
Leukocytes, UA: NEGATIVE
Nitrite, UA: NEGATIVE
Protein, UA: NEGATIVE
RBC UA: NEGATIVE
Urobilinogen, Ur: 0.2 mg/dL (ref 0.2–1.0)
pH, UA: 6.5 (ref 5.0–7.5)

## 2017-12-24 LAB — URINE CULTURE, REFLEX

## 2017-12-24 LAB — MICROSCOPIC EXAMINATION: Casts: NONE SEEN /lpf

## 2018-01-04 ENCOUNTER — Other Ambulatory Visit: Payer: Self-pay | Admitting: Nurse Practitioner

## 2018-01-05 LAB — COMPREHENSIVE METABOLIC PANEL
ALBUMIN: 4.5 g/dL (ref 3.5–5.5)
ALK PHOS: 97 IU/L (ref 39–117)
ALT: 18 IU/L (ref 0–32)
AST: 16 IU/L (ref 0–40)
Albumin/Globulin Ratio: 1.8 (ref 1.2–2.2)
BUN/Creatinine Ratio: 15 (ref 9–23)
BUN: 12 mg/dL (ref 6–24)
Bilirubin Total: 0.5 mg/dL (ref 0.0–1.2)
CO2: 22 mmol/L (ref 20–29)
CREATININE: 0.81 mg/dL (ref 0.57–1.00)
Calcium: 9.8 mg/dL (ref 8.7–10.2)
Chloride: 100 mmol/L (ref 96–106)
GFR, EST AFRICAN AMERICAN: 95 mL/min/{1.73_m2} (ref >59)
GFR, EST NON AFRICAN AMERICAN: 83 mL/min/{1.73_m2} (ref >59)
GLUCOSE: 97 mg/dL (ref 65–99)
Globulin, Total: 2.5 g/dL (ref 1.5–4.5)
Potassium: 4.9 mmol/L (ref 3.5–5.2)
SODIUM: 141 mmol/L (ref 134–144)
Total Protein: 7 g/dL (ref 6.0–8.5)

## 2018-01-05 LAB — VITAMIN D 25 HYDROXY (VIT D DEFICIENCY, FRACTURES): VIT D 25 HYDROXY: 32.2 ng/mL (ref 30.0–100.0)

## 2018-01-05 LAB — TSH: TSH: 1.76 u[IU]/mL (ref 0.450–4.500)

## 2018-01-05 LAB — LIPID PANEL W/O CHOL/HDL RATIO
Cholesterol, Total: 146 mg/dL (ref 100–199)
HDL: 49 mg/dL (ref >39)
LDL Calculated: 87 mg/dL (ref 0–99)
Triglycerides: 51 mg/dL (ref 0–149)
VLDL Cholesterol Cal: 10 mg/dL (ref 5–40)

## 2018-01-05 LAB — CBC
HEMATOCRIT: 43.9 % (ref 34.0–46.6)
Hemoglobin: 14.8 g/dL (ref 11.1–15.9)
MCH: 30.7 pg (ref 26.6–33.0)
MCHC: 33.7 g/dL (ref 31.5–35.7)
MCV: 91 fL (ref 79–97)
PLATELETS: 239 10*3/uL (ref 150–450)
RBC: 4.82 x10E6/uL (ref 3.77–5.28)
RDW: 12.1 % — AB (ref 12.3–15.4)
WBC: 5.6 10*3/uL (ref 3.4–10.8)

## 2018-01-05 LAB — T4, FREE: Free T4: 1.18 ng/dL (ref 0.82–1.77)

## 2018-01-05 LAB — T3: T3, Total: 127 ng/dL (ref 71–180)

## 2018-02-22 ENCOUNTER — Ambulatory Visit
Admission: RE | Admit: 2018-02-22 | Discharge: 2018-02-22 | Disposition: A | Discharge status: Home / Self Care | Payer: Managed Care, Other (non HMO) | Source: Ambulatory Visit | Attending: Nurse Practitioner | Admitting: Nurse Practitioner

## 2018-02-22 DIAGNOSIS — Z1239 Encounter for other screening for malignant neoplasm of breast: Secondary | ICD-10-CM | POA: Diagnosis present

## 2018-02-27 ENCOUNTER — Other Ambulatory Visit: Payer: Self-pay

## 2018-02-27 DIAGNOSIS — I10 Essential (primary) hypertension: Secondary | ICD-10-CM

## 2018-02-27 MED ORDER — LOSARTAN POTASSIUM 100 MG PO TABS: 50.0000 mg | ORAL_TABLET | Freq: Every day | ORAL | 4 refills | Status: DC

## 2018-05-02 ENCOUNTER — Other Ambulatory Visit: Payer: Self-pay

## 2018-05-02 DIAGNOSIS — I10 Essential (primary) hypertension: Secondary | ICD-10-CM

## 2018-05-02 MED ORDER — ATENOLOL 25 MG PO TABS: 25.0000 mg | ORAL_TABLET | Freq: Every day | ORAL | 1 refills | Status: DC

## 2018-06-22 ENCOUNTER — Other Ambulatory Visit: Payer: Self-pay

## 2018-06-22 ENCOUNTER — Ambulatory Visit: Payer: Managed Care, Other (non HMO) | Admitting: Nurse Practitioner

## 2018-06-22 ENCOUNTER — Encounter: Payer: Self-pay | Admitting: Nurse Practitioner

## 2018-06-22 VITALS — BP 159/89 | HR 72 | Resp 16 | Ht 64.0 in

## 2018-06-22 DIAGNOSIS — L02224 Furuncle of groin: Secondary | ICD-10-CM | POA: Diagnosis not present

## 2018-06-22 DIAGNOSIS — I10 Essential (primary) hypertension: Secondary | ICD-10-CM | POA: Diagnosis not present

## 2018-06-22 DIAGNOSIS — K219 Gastro-esophageal reflux disease without esophagitis: Secondary | ICD-10-CM | POA: Diagnosis not present

## 2018-06-22 MED ORDER — LIDOCAINE VISCOUS HCL 2 % MT SOLN: OROMUCOSAL | 0 refills | Status: DC

## 2018-06-22 MED ORDER — CEPHALEXIN 500 MG PO CAPS: 500.0000 mg | ORAL_CAPSULE | Freq: Three times a day (TID) | ORAL | 0 refills | Status: DC

## 2018-06-22 NOTE — Progress Notes (Signed)
[facility] [address] [place], [place] [address]  Internal MEDICINE  Telephone Visit  Patient Name: Margaret Lynch  [id number]  [id number]  Date of Service: 07/05/2018  I connected with the patient at 2:50pm by telephone and verified the patients identity using two identifiers.   I discussed the limitations, risks, security and privacy concerns of performing an evaluation and management service by telephone and the availability of in person appointments. I also discussed with the patient that there may be a patient responsible charge related to the service.  The patient expressed understanding and agrees to proceed.    Chief Complaint  Patient presents with  . Telephone Assessment  . Telephone Screen  . Mass    left side, painful to touch  . Hypertension    The patient has been contacted via webcam for follow up visit due to concerns for spread of novel coronavirus. She states that she has noted a very tender lump in periianal area. Has been present for about one and a half months. Has gradually become larger and more sore. She states that skin is intact and there is no drainage present. She states that the lesion is so tender it is hurting her to sit for any length of time.       Current Medication: Outpatient Encounter Medications as of 06/22/2018  Medication Sig  . atenolol (TENORMIN) 25 MG tablet Take 1 tablet (25 mg total) by mouth at bedtime.  . fluticasone (FLONASE) 50 MCG/ACT nasal spray Place 2 sprays into both nostrils daily.  Marland Kitchen losartan (COZAAR) 100 MG tablet Take 0.5 tablets (50 mg total) by mouth daily.  Marland Kitchen omeprazole (PRILOSEC) 40 MG capsule Take 1 capsule (40 mg total) by mouth daily.  . traZODone (DESYREL) 50 MG tablet Take 0.5-1 tablets (25-50 mg total) by mouth at bedtime as needed for sleep.  . cephALEXin (KEFLEX) 500 MG capsule Take 1 capsule (500 mg total) by mouth 3 (three) times daily.  Marland Kitchen lidocaine (XYLOCAINE) 2 % solution Apply very small  amount to affect area three to four times daily as needed   No facility-administered encounter medications on file as of 06/22/2018.     Surgical History: History reviewed. No pertinent surgical history.  Medical History: Past Medical History:  Diagnosis Date  . Acid reflux   . HTN (hypertension)   . Nocturia   . Overweight   . Urinary frequency     Family History: Family History  Problem Relation Age of Onset  . Hypertension Mother   . Diabetes Mother   . Esophageal cancer Father   . Kidney disease Neg Hx   . Prostate cancer Neg Hx   . Bladder Cancer Neg Hx   . Breast cancer Neg Hx     Social History   Socioeconomic History  . Marital status: Single    Spouse name: Not on file  . Number of children: Not on file  . Years of education: Not on file  . Highest education level: Not on file  Occupational History  . Not on file  Social Needs  . Financial resource strain: Not on file  . Food insecurity:    Worry: Not on file    Inability: Not on file  . Transportation needs:    Medical: Not on file    Non-medical: Not on file  Tobacco Use  . Smoking status: Never Smoker  . Smokeless tobacco: Never Used  Substance and Sexual Activity  . Alcohol use: No  Alcohol/week: 0.0 standard drinks  . Drug use: No  . Sexual activity: Not on file  Lifestyle  . Physical activity:    Days per week: Not on file    Minutes per session: Not on file  . Stress: Not on file  Relationships  . Social connections:    Talks on phone: Not on file    Gets together: Not on file    Attends religious service: Not on file    Active member of club or organization: Not on file    Attends meetings of clubs or organizations: Not on file    Relationship status: Not on file  . Intimate partner violence:    Fear of current or ex partner: Not on file    Emotionally abused: Not on file    Physically abused: Not on file    Forced sexual activity: Not on file  Other Topics Concern  . Not on  file  Social History Narrative  . Not on file      Review of Systems  Constitutional: Positive for fatigue. Negative for activity change, appetite change, chills, fever and unexpected weight change.  HENT: Negative for ear pain, postnasal drip, rhinorrhea, sinus pressure, sinus pain, sore throat, tinnitus and trouble swallowing.   Eyes: Negative.  Negative for photophobia, discharge, redness, itching and visual disturbance.  Respiratory: Negative for apnea, cough, choking, chest tightness, shortness of breath and wheezing.   Cardiovascular: Negative for chest pain, palpitations and leg swelling.       Elevated blood pressure   Gastrointestinal: Negative for abdominal distention, blood in stool, constipation, diarrhea, nausea and vomiting.  Endocrine: Negative for cold intolerance, heat intolerance, polydipsia and polyuria.  Genitourinary: Negative for difficulty urinating, dyspareunia, dysuria, flank pain, frequency, menstrual problem, pelvic pain and vaginal bleeding.       Abscess type lesion in perianal area which is very sore and getting larger. No drainage. Hurting her to sit for any length of time.   Musculoskeletal: Negative for arthralgias, back pain, gait problem, joint swelling, myalgias and neck pain.  Skin: Negative for rash.  Allergic/Immunologic: Positive for environmental allergies. Negative for food allergies.  Neurological: Positive for headaches. Negative for dizziness, tremors, syncope and weakness.  Hematological: Negative for adenopathy. Does not bruise/bleed easily.  Psychiatric/Behavioral: Negative for agitation, dysphoric mood, hallucinations, self-injury and suicidal ideas. The patient is not nervous/anxious.    Today's Vitals   06/22/18 1414  BP: (!) 159/89  Pulse: 72  Resp: 16  Height:  (1.626 m)   Body mass index is 34.67 kg/m.  Observation/Objective:   1. Furuncle of groin Start cephalexin 541m tid for 10 days. May use viscous lidocaine (very  small amount) to affected area three to four times dailly as needed to help with pain. If no improvement In few days, may need surgical consultation.  - cephALEXin (KEFLEX) 500 MG capsule; Take 1 capsule (500 mg total) by mouth 3 (three) times daily.  Dispense: 30 capsule; Refill: 0 - lidocaine (XYLOCAINE) 2 % solution; Apply very small amount to affect area three to four times daily as needed  Dispense: 15 mL; Refill: 0  2. Benign essential HTN Generally stable. Continue bp medication as prescribed   3. Gastroesophageal reflux disease without esophagitis Well managed. Continue omeprazole as prescribed    Assessment/Plan:   General Counseling: [name] understanding of the findings of today's phone visit and agrees with plan of treatment. I have discussed any further diagnostic evaluation that may be needed or ordered  today. We also reviewed her medications today. she has been encouraged to call the office with any questions or concerns that should arise related to todays visit.  This patient was seen by Vincent GrosHeather Glynis Hunsucker FNP Collaboration with Dr Lyndon CodeFozia M Khan as a part of collaborative care agreement  Meds ordered this encounter  Medications  . cephALEXin (KEFLEX) 500 MG capsule    Sig: Take 1 capsule (500 mg total) by mouth 3 (three) times daily.    Dispense:  30 capsule    Refill:  0    Order Specific Question:   Supervising Provider    Answer:   Lyndon CodeKHAN, FOZIA M [[id number]]  . lidocaine (XYLOCAINE) 2 % solution    Sig: Apply very small amount to affect area three to four times daily as needed    Dispense:  15 mL    Refill:  0    Order Specific Question:   Supervising Provider    Answer:   Lyndon CodeKHAN, FOZIA M [[id number]]    Time spent: 5515 Minutes    Dr Lyndon CodeFozia M Khan Internal medicine

## 2018-07-05 DIAGNOSIS — L02224 Furuncle of groin: Secondary | ICD-10-CM | POA: Insufficient documentation

## 2018-07-07 ENCOUNTER — Other Ambulatory Visit: Payer: Self-pay

## 2018-07-07 ENCOUNTER — Ambulatory Visit (INDEPENDENT_AMBULATORY_CARE_PROVIDER_SITE_OTHER): Payer: Managed Care, Other (non HMO) | Admitting: Nurse Practitioner

## 2018-07-07 ENCOUNTER — Encounter: Payer: Self-pay | Admitting: Nurse Practitioner

## 2018-07-07 VITALS — BP 150/82 | HR 85 | Resp 16 | Ht 64.0 in | Wt 203.8 lb

## 2018-07-07 DIAGNOSIS — B372 Candidiasis of skin and nail: Secondary | ICD-10-CM

## 2018-07-07 DIAGNOSIS — I10 Essential (primary) hypertension: Secondary | ICD-10-CM

## 2018-07-07 DIAGNOSIS — K219 Gastro-esophageal reflux disease without esophagitis: Secondary | ICD-10-CM

## 2018-07-07 MED ORDER — NYSTATIN 100000 UNIT/GM EX OINT: 1.0000 "application " | TOPICAL_OINTMENT | Freq: Three times a day (TID) | CUTANEOUS | 1 refills | Status: DC

## 2018-07-07 MED ORDER — LOSARTAN POTASSIUM 100 MG PO TABS: 100.0000 mg | ORAL_TABLET | Freq: Every day | ORAL | 3 refills | Status: DC

## 2018-07-07 NOTE — Progress Notes (Signed)
Pt blood pressure elevated,, taken twice 1st reading 171/88 2nd reading 150/82 Informed provider.

## 2018-07-07 NOTE — Progress Notes (Signed)
[facility] [address] [place], [place] [address]  Internal MEDICINE  Office Visit Note  Patient Name: Margaret Lynch  [id number]  [id number]  Date of Service: 07/12/2018  Chief Complaint  Patient presents with  . Medical Management of Chronic Issues    2wk follow up  . Hypertension    Patient here for routine follow up. Recently treated for abscess to perianal abscess. States that despite antibiotics, she has not noted much of an improvement. She states that pain and inflammation is along the left side of the labia and back toward the buttocks. Cannot feel focal lesion. Blood pressure is mildly elevated.       Current Medication: Outpatient Encounter Medications as of 07/07/2018  Medication Sig  . atenolol (TENORMIN) 25 MG tablet Take 1 tablet (25 mg total) by mouth at bedtime.  . fluticasone (FLONASE) 50 MCG/ACT nasal spray Place 2 sprays into both nostrils daily.  Marland Kitchen lidocaine (XYLOCAINE) 2 % solution Apply very small amount to affect area three to four times daily as needed  . losartan (COZAAR) 100 MG tablet Take 1 tablet (100 mg total) by mouth daily.  Marland Kitchen omeprazole (PRILOSEC) 40 MG capsule Take 1 capsule (40 mg total) by mouth daily.  . traZODone (DESYREL) 50 MG tablet Take 0.5-1 tablets (25-50 mg total) by mouth at bedtime as needed for sleep.  . [DISCONTINUED] cephALEXin (KEFLEX) 500 MG capsule Take 1 capsule (500 mg total) by mouth 3 (three) times daily.  . [DISCONTINUED] losartan (COZAAR) 100 MG tablet Take 0.5 tablets (50 mg total) by mouth daily.  Marland Kitchen nystatin ointment (MYCOSTATIN) Apply 1 application topically 3 (three) times daily.   No facility-administered encounter medications on file as of 07/07/2018.     Surgical History: History reviewed. No pertinent surgical history.  Medical History: Past Medical History:  Diagnosis Date  . Acid reflux   . HTN (hypertension)   . Nocturia   . Overweight   . Urinary frequency     Family  History: Family History  Problem Relation Age of Onset  . Hypertension Mother   . Diabetes Mother   . Esophageal cancer Father   . Kidney disease Neg Hx   . Prostate cancer Neg Hx   . Bladder Cancer Neg Hx   . Breast cancer Neg Hx     Social History   Socioeconomic History  . Marital status: Single    Spouse name: Not on file  . Number of children: Not on file  . Years of education: Not on file  . Highest education level: Not on file  Occupational History  . Not on file  Social Needs  . Financial resource strain: Not on file  . Food insecurity:    Worry: Not on file    Inability: Not on file  . Transportation needs:    Medical: Not on file    Non-medical: Not on file  Tobacco Use  . Smoking status: Never Smoker  . Smokeless tobacco: Never Used  Substance and Sexual Activity  . Alcohol use: No    Alcohol/week: 0.0 standard drinks  . Drug use: No  . Sexual activity: Not on file  Lifestyle  . Physical activity:    Days per week: Not on file    Minutes per session: Not on file  . Stress: Not on file  Relationships  . Social connections:    Talks on phone: Not on file    Gets together: Not on file    Attends religious  service: Not on file    Active member of club or organization: Not on file    Attends meetings of clubs or organizations: Not on file    Relationship status: Not on file  . Intimate partner violence:    Fear of current or ex partner: Not on file    Emotionally abused: Not on file    Physically abused: Not on file    Forced sexual activity: Not on file  Other Topics Concern  . Not on file  Social History Narrative  . Not on file      Review of Systems  Constitutional: Positive for fatigue. Negative for activity change, appetite change, chills, fever and unexpected weight change.  HENT: Negative for ear pain, postnasal drip, rhinorrhea, sinus pressure, sinus pain, sore throat, tinnitus and trouble swallowing.   Respiratory: Negative for apnea,  cough, choking, chest tightness, shortness of breath and wheezing.   Cardiovascular: Negative for chest pain, palpitations and leg swelling.       Elevated blood pressure   Gastrointestinal: Negative for abdominal distention, blood in stool, constipation, diarrhea, nausea and vomiting.  Endocrine: Negative for cold intolerance, heat intolerance, polydipsia and polyuria.  Genitourinary: Negative for difficulty urinating, dyspareunia, dysuria, flank pain, frequency, menstrual problem, pelvic pain and vaginal bleeding.       Abscess type lesion in perianal area which is very sore and getting larger. No drainage. Hurting her to sit for any length of time. Has not improved despite antibiotic treatment.   Skin: Negative for rash.  Allergic/Immunologic: Positive for environmental allergies. Negative for food allergies.  Neurological: Positive for headaches. Negative for dizziness, tremors, syncope and weakness.  Hematological: Negative for adenopathy. Does not bruise/bleed easily.  Psychiatric/Behavioral: Negative for agitation, dysphoric mood, hallucinations, self-injury and suicidal ideas. The patient is not nervous/anxious.     Today's Vitals   07/07/18 1516  BP: (!) 150/82  Pulse: 85  Resp: 16  SpO2: 96%  Weight: 203 lb 12.8 oz (92.4 kg)  Height: 5\' 4"  (1.626 m)   Body mass index is 34.98 kg/m.   Physical Exam Vitals signs and nursing note reviewed.  Constitutional:      General: She is not in acute distress.    Appearance: Normal appearance. She is well-developed. She is not diaphoretic.  HENT:     Head: Normocephalic and atraumatic.     Mouth/Throat:     Pharynx: No oropharyngeal exudate.  Eyes:     Pupils: Pupils are equal, round, and reactive to light.  Neck:     Musculoskeletal: Normal range of motion and neck supple.     Thyroid: No thyromegaly.     Vascular: No JVD.     Trachea: No tracheal deviation.  Cardiovascular:     Rate and Rhythm: Normal rate and regular  rhythm.     Heart sounds: Normal heart sounds. No murmur. No friction rub. No gallop.   Pulmonary:     Effort: Pulmonary effort is normal. No respiratory distress.     Breath sounds: Normal breath sounds. No wheezing or rales.  Chest:     Chest wall: No tenderness.  Abdominal:     General: Bowel sounds are normal.     Palpations: Abdomen is soft.  Genitourinary:    Comments: Left labia very red and irritated appearing. Stretches to the left groin and back to the buttocks. There is no abscess or focal lesion present. Skin is intact with no draiange present.  Musculoskeletal: Normal range of motion.  Lymphadenopathy:  Cervical: No cervical adenopathy.  Skin:    General: Skin is warm and dry.  Neurological:     Mental Status: She is alert and oriented to person, place, and time.     Cranial Nerves: No cranial nerve deficit.  Psychiatric:        Behavior: Behavior normal.        Thought Content: Thought content normal.        Judgment: Judgment normal.    Assessment/Plan: 1. Benign essential HTN Increase losartan back to 100mg  daily. Continue atenolol as prescribed  - losartan (COZAAR) 100 MG tablet; Take 1 tablet (100 mg total) by mouth daily.  Dispense: 90 tablet; Refill: 3  2. Cutaneous candidiasis Start nystatin cream. Apply to affected areas three times daily for next 7 to 10 days.  - nystatin ointment (MYCOSTATIN); Apply 1 application topically 3 (three) times daily.  Dispense: 30 g; Refill: 1  3. Gastroesophageal reflux disease without esophagitis Continue omeprazole as prescribed   General Counseling: Letitia NeriLinda verbalizes understanding of the findings of todays visit and agrees with plan of treatment. I have discussed any further diagnostic evaluation that may be needed or ordered today. We also reviewed her medications today. she has been encouraged to call the office with any questions or concerns that should arise related to todays visit.  Hypertension Counseling:    The following hypertensive lifestyle modification were recommended and discussed:  1. Limiting alcohol intake to less than 1 oz/day of ethanol:(24 oz of beer or 8 oz of wine or 2 oz of 100-proof whiskey). 2. Take baby ASA 81 mg daily. 3. Importance of regular aerobic exercise and losing weight. 4. Reduce dietary saturated fat and cholesterol intake for overall cardiovascular health. 5. Maintaining adequate dietary potassium, calcium, and magnesium intake. 6. Regular monitoring of the blood pressure. 7. Reduce sodium intake to less than 100 mmol/day (less than 2.3 gm of sodium or less than 6 gm of sodium choride)   This patient was seen by Vincent GrosHeather Dominigue Gellner FNP Collaboration with Dr Lyndon CodeFozia M Khan as a part of collaborative care agreement  Meds ordered this encounter  Medications  . nystatin ointment (MYCOSTATIN)    Sig: Apply 1 application topically 3 (three) times daily.    Dispense:  30 g    Refill:  1    Order Specific Question:   Supervising Provider    Answer:   Lyndon CodeKHAN, FOZIA M [[id number]]  . losartan (COZAAR) 100 MG tablet    Sig: Take 1 tablet (100 mg total) by mouth daily.    Dispense:  90 tablet    Refill:  3    Please note increased dosing .    Order Specific Question:   Supervising Provider    Answer:   Lyndon CodeKHAN, FOZIA M [[id number]]    Time spent: 625 Minutes      Dr Lyndon CodeFozia M Khan Internal medicine

## 2018-07-12 DIAGNOSIS — B372 Candidiasis of skin and nail: Secondary | ICD-10-CM | POA: Insufficient documentation

## 2018-07-28 ENCOUNTER — Other Ambulatory Visit: Payer: Self-pay

## 2018-07-28 ENCOUNTER — Ambulatory Visit: Payer: Managed Care, Other (non HMO) | Admitting: Nurse Practitioner

## 2018-07-28 ENCOUNTER — Encounter: Payer: Self-pay | Admitting: Nurse Practitioner

## 2018-07-28 VITALS — BP 152/87 | HR 81 | Temp 97.3°F | Resp 16 | Ht 64.0 in | Wt 200.8 lb

## 2018-07-28 DIAGNOSIS — B372 Candidiasis of skin and nail: Secondary | ICD-10-CM | POA: Diagnosis not present

## 2018-07-28 DIAGNOSIS — I10 Essential (primary) hypertension: Secondary | ICD-10-CM | POA: Diagnosis not present

## 2018-07-28 MED ORDER — NYSTATIN 100000 UNIT/GM EX POWD: Freq: Three times a day (TID) | CUTANEOUS | 1 refills | Status: DC

## 2018-07-28 MED ORDER — FLUCONAZOLE 150 MG PO TABS: ORAL_TABLET | ORAL | 0 refills | Status: DC

## 2018-07-28 MED ORDER — KETOCONAZOLE 2 % EX CREA: 1.0000 "application " | TOPICAL_CREAM | Freq: Every day | CUTANEOUS | 1 refills | Status: DC

## 2018-07-28 NOTE — Progress Notes (Signed)
[facility] [place], [place] [address]  Internal MEDICINE  Office Visit Note  Patient Name: Margaret Lynch  [id number]  [id number]  Date of Service: 07/28/2018   Pt is here for a sick visit.  Chief Complaint  Patient presents with  . Vaginal Pain    left side of vagina has been swollen and painful been going on for a couple of months, noticed a bump/knot previously but it is not there now. pt had not changed soap or laundry detergent recently, pt stated that the pain comes and goes     The patient continues to have rash which is very irritated on the right side of labia. Started with antibiotics, thinking it was infected hair follicle. Changed this to nystatin cream when able to visually examine the area. Appeared to be more of fungal type rash. Has been using nystatin cream since her last visit. She states the area still feels very irritated and even swollen. Hurts to sit for any length of time.        Current Medication:  Outpatient Encounter Medications as of 07/28/2018  Medication Sig Note  . atenolol (TENORMIN) 25 MG tablet Take 1 tablet (25 mg total) by mouth at bedtime.   . fluticasone (FLONASE) 50 MCG/ACT nasal spray Place 2 sprays into both nostrils daily.   Marland Kitchen lidocaine (XYLOCAINE) 2 % solution Apply very small amount to affect area three to four times daily as needed   . losartan (COZAAR) 100 MG tablet Take 1 tablet (100 mg total) by mouth daily.   Marland Kitchen omeprazole (PRILOSEC) 40 MG capsule Take 1 capsule (40 mg total) by mouth daily.   . traZODone (DESYREL) 50 MG tablet Take 0.5-1 tablets (25-50 mg total) by mouth at bedtime as needed for sleep.   . [DISCONTINUED] nystatin ointment (MYCOSTATIN) Apply 1 application topically 3 (three) times daily. 07/28/2018: change to powder form   . fluconazole (DIFLUCAN) 150 MG tablet Take 1 tablet po QOD for 5 doses   . ketoconazole (NIZORAL) 2 % cream Apply 1 application topically daily.   Marland Kitchen nystatin  (MYCOSTATIN/NYSTOP) powder Apply topically 3 (three) times daily.    No facility-administered encounter medications on file as of 07/28/2018.       Medical History: Past Medical History:  Diagnosis Date  . Acid reflux   . HTN (hypertension)   . Nocturia   . Overweight   . Urinary frequency     Today's Vitals   07/28/18 0851  BP: (!) 152/87  Pulse: 81  Resp: 16  Temp: (!) 97.3 F (36.3 C)  SpO2: 97%  Weight: 200 lb 12.8 oz (91.1 kg)  Height: 5\' 4"  (1.626 m)   Body mass index is 34.47 kg/m.  Review of Systems  Constitutional: Negative for activity change, appetite change, chills, fatigue, fever and unexpected weight change.  HENT: Negative for ear pain, postnasal drip, rhinorrhea, sinus pressure, sinus pain, sore throat, tinnitus and trouble swallowing.   Respiratory: Negative for apnea, cough, choking, chest tightness, shortness of breath and wheezing.   Cardiovascular: Negative for chest pain, palpitations and leg swelling.       Elevated blood pressure.   Gastrointestinal: Negative for abdominal distention, blood in stool, constipation, diarrhea, nausea and vomiting.  Endocrine: Negative for cold intolerance, heat intolerance, polydipsia and polyuria.  Genitourinary:       Rash and skin irritation along the left labia. Feels swollen and tender.   Skin: Negative for rash.  Allergic/Immunologic: Positive for environmental allergies.  Negative for food allergies.  Neurological: Negative for dizziness, tremors, syncope, weakness and headaches.  Hematological: Negative for adenopathy. Does not bruise/bleed easily.  Psychiatric/Behavioral: Negative for agitation, dysphoric mood, hallucinations, self-injury and suicidal ideas. The patient is not nervous/anxious.     Physical Exam Vitals signs and nursing note reviewed.  Constitutional:      General: She is not in acute distress.    Appearance: Normal appearance. She is well-developed. She is not diaphoretic.  HENT:      Head: Normocephalic and atraumatic.     Mouth/Throat:     Pharynx: No oropharyngeal exudate.  Eyes:     Pupils: Pupils are equal, round, and reactive to light.  Neck:     Musculoskeletal: Normal range of motion and neck supple.     Thyroid: No thyromegaly.     Vascular: No JVD.     Trachea: No tracheal deviation.  Cardiovascular:     Rate and Rhythm: Normal rate and regular rhythm.     Heart sounds: Normal heart sounds. No murmur. No friction rub. No gallop.   Pulmonary:     Effort: Pulmonary effort is normal. No respiratory distress.     Breath sounds: Normal breath sounds. No wheezing or rales.  Chest:     Chest wall: No tenderness.  Abdominal:     General: Bowel sounds are normal.     Palpations: Abdomen is soft.  Genitourinary:    Comments: Left labia very red and irritated appearing. Improved in appearance since most recent visit. There is no abscess or focal lesion present. Skin is intact with no draiange present.  Musculoskeletal: Normal range of motion.  Lymphadenopathy:     Cervical: No cervical adenopathy.  Skin:    General: Skin is warm and dry.  Neurological:     Mental Status: She is alert and oriented to person, place, and time.     Cranial Nerves: No cranial nerve deficit.  Psychiatric:        Behavior: Behavior normal.        Thought Content: Thought content normal.        Judgment: Judgment normal.    Assessment/Plan: 1. Cutaneous candidiasis Add diflucan 150mg  every other day for total of 5 doses. Use ketoconazole topically daily. Apply nystatin osder to area to keep area dry and from further infection.  - fluconazole (DIFLUCAN) 150 MG tablet; Take 1 tablet po QOD for 5 doses  Dispense: 3 tablet; Refill: 0 - nystatin (MYCOSTATIN/NYSTOP) powder; Apply topically 3 (three) times daily.  Dispense: 60 g; Refill: 1 - ketoconazole (NIZORAL) 2 % cream; Apply 1 application topically daily.  Dispense: 30 g; Refill: 1  2. Benign essential HTN Continue with increased  dose losartan. Follow up in 2 weeks   General Counseling: Margaret Lynch verbalizes understanding of the findings of todays visit and agrees with plan of treatment. I have discussed any further diagnostic evaluation that may be needed or ordered today. We also reviewed her medications today. she has been encouraged to call the office with any questions or concerns that should arise related to todays visit.    Counseling:  Hypertension Counseling:   The following hypertensive lifestyle modification were recommended and discussed:  1. Limiting alcohol intake to less than 1 oz/day of ethanol:(24 oz of beer or 8 oz of wine or 2 oz of 100-proof whiskey). 2. Take baby ASA 81 mg daily. 3. Importance of regular aerobic exercise and losing weight. 4. Reduce dietary saturated fat and cholesterol intake for overall cardiovascular  health. 5. Maintaining adequate dietary potassium, calcium, and magnesium intake. 6. Regular monitoring of the blood pressure. 7. Reduce sodium intake to less than 100 mmol/day (less than 2.3 gm of sodium or less than 6 gm of sodium choride)   This patient was seen by Vincent GrosHeather Maiko Salais FNP Collaboration with Dr Lyndon CodeFozia M Khan as a part of collaborative care agreement  Meds ordered this encounter  Medications  . fluconazole (DIFLUCAN) 150 MG tablet    Sig: Take 1 tablet po QOD for 5 doses    Dispense:  3 tablet    Refill:  0    Order Specific Question:   Supervising Provider    Answer:   Lyndon CodeKHAN, FOZIA M [[id number]]  . nystatin (MYCOSTATIN/NYSTOP) powder    Sig: Apply topically 3 (three) times daily.    Dispense:  60 g    Refill:  1    Change nystatin ointment to powder form .    Order Specific Question:   Supervising Provider    Answer:   Lyndon CodeKHAN, FOZIA M [[id number]]  . ketoconazole (NIZORAL) 2 % cream    Sig: Apply 1 application topically daily.    Dispense:  30 g    Refill:  1    Order Specific Question:   Supervising Provider    Answer:   Lyndon CodeKHAN, FOZIA M [[id number]]    Time spent: 25  Minutes

## 2018-07-28 NOTE — Progress Notes (Signed)
Pt blood pressure elevated, informed provider. 

## 2018-08-10 ENCOUNTER — Encounter: Payer: Self-pay | Admitting: Adult Health

## 2018-08-10 ENCOUNTER — Ambulatory Visit: Payer: Managed Care, Other (non HMO) | Admitting: Adult Health

## 2018-08-10 ENCOUNTER — Other Ambulatory Visit: Payer: Self-pay

## 2018-08-10 VITALS — BP 128/86 | HR 65 | Resp 16 | Ht 64.0 in | Wt 199.0 lb

## 2018-08-10 DIAGNOSIS — B372 Candidiasis of skin and nail: Secondary | ICD-10-CM | POA: Diagnosis not present

## 2018-08-10 DIAGNOSIS — K219 Gastro-esophageal reflux disease without esophagitis: Secondary | ICD-10-CM

## 2018-08-10 DIAGNOSIS — I10 Essential (primary) hypertension: Secondary | ICD-10-CM | POA: Diagnosis not present

## 2018-08-10 NOTE — Progress Notes (Signed)
[facility] [address] [place], [place] [address]  Internal MEDICINE  Office Visit Note  Patient Name: Margaret Lynch  [id number]  [id number]  Date of Service: 08/10/2018  Chief Complaint  Patient presents with  . Medical Management of Chronic Issues    4 week follow up losartan increased     HPI  Pt is here for follow up on blood pressure.  She is currently taking 100mg  of losartan daily.  She denies any side effects of the medication.  Her bp today is 128/86.  She Denies Chest pain, Shortness of breath, palpitations, headache, or blurred vision. She was seen last month and treated for some cutaneous candidiasis on her labia.  She reports the symptoms have become better, but are not completely resolved.    Current Medication: Outpatient Encounter Medications as of 08/10/2018  Medication Sig  . atenolol (TENORMIN) 25 MG tablet Take 1 tablet (25 mg total) by mouth at bedtime.  . fluconazole (DIFLUCAN) 150 MG tablet Take 1 tablet po QOD for 5 doses  . fluticasone (FLONASE) 50 MCG/ACT nasal spray Place 2 sprays into both nostrils daily.  Marland Kitchen. ketoconazole (NIZORAL) 2 % cream Apply 1 application topically daily.  Marland Kitchen. lidocaine (XYLOCAINE) 2 % solution Apply very small amount to affect area three to four times daily as needed  . losartan (COZAAR) 100 MG tablet Take 1 tablet (100 mg total) by mouth daily.  Marland Kitchen. nystatin (MYCOSTATIN/NYSTOP) powder Apply topically 3 (three) times daily.  Marland Kitchen. omeprazole (PRILOSEC) 40 MG capsule Take 1 capsule (40 mg total) by mouth daily.  . traZODone (DESYREL) 50 MG tablet Take 0.5-1 tablets (25-50 mg total) by mouth at bedtime as needed for sleep.   No facility-administered encounter medications on file as of 08/10/2018.     Surgical History: History reviewed. No pertinent surgical history.  Medical History: Past Medical History:  Diagnosis Date  . Acid reflux   . HTN (hypertension)   . Nocturia   . Overweight   . Urinary frequency      Family History: Family History  Problem Relation Age of Onset  . Hypertension Mother   . Diabetes Mother   . Esophageal cancer Father   . Kidney disease Neg Hx   . Prostate cancer Neg Hx   . Bladder Cancer Neg Hx   . Breast cancer Neg Hx     Social History   Socioeconomic History  . Marital status: Single    Spouse name: Not on file  . Number of children: Not on file  . Years of education: Not on file  . Highest education level: Not on file  Occupational History  . Not on file  Social Needs  . Financial resource strain: Not on file  . Food insecurity    Worry: Not on file    Inability: Not on file  . Transportation needs    Medical: Not on file    Non-medical: Not on file  Tobacco Use  . Smoking status: Never Smoker  . Smokeless tobacco: Never Used  Substance and Sexual Activity  . Alcohol use: No    Alcohol/week: 0.0 standard drinks  . Drug use: No  . Sexual activity: Not on file  Lifestyle  . Physical activity    Days per week: Not on file    Minutes per session: Not on file  . Stress: Not on file  Relationships  . Social Musicianconnections    Talks on phone: Not on file    Gets together:  Not on file    Attends religious service: Not on file    Active member of club or organization: Not on file    Attends meetings of clubs or organizations: Not on file    Relationship status: Not on file  . Intimate partner violence    Fear of current or ex partner: Not on file    Emotionally abused: Not on file    Physically abused: Not on file    Forced sexual activity: Not on file  Other Topics Concern  . Not on file  Social History Narrative  . Not on file      Review of Systems  Constitutional: Negative for chills, fatigue and unexpected weight change.  HENT: Negative for congestion, rhinorrhea, sneezing and sore throat.   Eyes: Negative for photophobia, pain and redness.  Respiratory: Negative for cough, chest tightness and shortness of breath.    Cardiovascular: Negative for chest pain and palpitations.  Gastrointestinal: Negative for abdominal pain, constipation, diarrhea, nausea and vomiting.  Endocrine: Negative.   Genitourinary: Negative for dysuria and frequency.  Musculoskeletal: Negative for arthralgias, back pain, joint swelling and neck pain.  Skin: Negative for rash.  Allergic/Immunologic: Negative.   Neurological: Negative for tremors and numbness.  Hematological: Negative for adenopathy. Does not bruise/bleed easily.  Psychiatric/Behavioral: Negative for behavioral problems and sleep disturbance. The patient is not nervous/anxious.     Vital Signs: BP 128/86   Pulse 65   Resp 16   Ht 5\' 4"  (1.626 m)   Wt 199 lb (90.3 kg)   SpO2 98%   BMI 34.16 kg/m    Physical Exam Vitals signs and nursing note reviewed.  Constitutional:      General: She is not in acute distress.    Appearance: She is well-developed. She is not diaphoretic.  HENT:     Head: Normocephalic and atraumatic.     Mouth/Throat:     Pharynx: No oropharyngeal exudate.  Eyes:     Pupils: Pupils are equal, round, and reactive to light.  Neck:     Musculoskeletal: Normal range of motion and neck supple.     Thyroid: No thyromegaly.     Vascular: No JVD.     Trachea: No tracheal deviation.  Cardiovascular:     Rate and Rhythm: Normal rate and regular rhythm.     Heart sounds: Normal heart sounds. No murmur. No friction rub. No gallop.   Pulmonary:     Effort: Pulmonary effort is normal. No respiratory distress.     Breath sounds: Normal breath sounds. No wheezing or rales.  Chest:     Chest wall: No tenderness.  Abdominal:     Palpations: Abdomen is soft.     Tenderness: There is no abdominal tenderness. There is no guarding.  Musculoskeletal: Normal range of motion.  Lymphadenopathy:     Cervical: No cervical adenopathy.  Skin:    General: Skin is warm and dry.  Neurological:     Mental Status: She is alert and oriented to person,  place, and time.     Cranial Nerves: No cranial nerve deficit.  Psychiatric:        Behavior: Behavior normal.        Thought Content: Thought content normal.        Judgment: Judgment normal.    Assessment/Plan: 1. Benign essential HTN Improved, continue Atenolol and losartan as prescribed and follow bp at home.   2. Gastroesophageal reflux disease without esophagitis Stable, continue Prilosec as directed.  3. Cutaneous candidiasis Much improved, continue Nystatin powder as prescribed and return to clinic if symptoms continue or worsen.   General Counseling: alcie runions understanding of the findings of todays visit and agrees with plan of treatment. I have discussed any further diagnostic evaluation that may be needed or ordered today. We also reviewed her medications today. she has been encouraged to call the office with any questions or concerns that should arise related to todays visit.    No orders of the defined types were placed in this encounter.   No orders of the defined types were placed in this encounter.   Time spent: 25 Minutes   This patient was seen by Orson Gear AGNP-C in Collaboration with Dr Lavera Guise as a part of collaborative care agreement     Kendell Bane AGNP-C Internal medicine

## 2018-09-08 ENCOUNTER — Ambulatory Visit: Payer: Managed Care, Other (non HMO) | Admitting: Adult Health

## 2018-09-08 ENCOUNTER — Other Ambulatory Visit: Payer: Self-pay

## 2018-09-08 ENCOUNTER — Encounter: Payer: Self-pay | Admitting: Adult Health

## 2018-09-08 VITALS — BP 147/76 | HR 62 | Temp 96.2°F | Ht 64.0 in | Wt 201.0 lb

## 2018-09-08 DIAGNOSIS — K219 Gastro-esophageal reflux disease without esophagitis: Secondary | ICD-10-CM

## 2018-09-08 DIAGNOSIS — R1032 Left lower quadrant pain: Secondary | ICD-10-CM | POA: Diagnosis not present

## 2018-09-08 DIAGNOSIS — I10 Essential (primary) hypertension: Secondary | ICD-10-CM | POA: Diagnosis not present

## 2018-09-08 NOTE — Progress Notes (Signed)
[facility] [address] [place], [place] [address]  Internal MEDICINE  Office Visit Note  Patient Name: Margaret Lynch  [id number]  [id number]  Date of Service: 09/08/2018  Chief Complaint  Patient presents with  . Groin Swelling    left side is swollen, surface pain, pinkish     HPI Pt is here for a sick visit. Pt is here with continued groin discomfort.  She reports this discomfort is intermittent, however it occurs "most days".  She has been using the nystatin powder and ketoconazole cream.  She denies any rash or sores on the skin, and describes it as aching pain that lasts a few seconds.    Current Medication:  Outpatient Encounter Medications as of 09/08/2018  Medication Sig  . atenolol (TENORMIN) 25 MG tablet Take 1 tablet (25 mg total) by mouth at bedtime.  . fluconazole (DIFLUCAN) 150 MG tablet Take 1 tablet po QOD for 5 doses  . fluticasone (FLONASE) 50 MCG/ACT nasal spray Place 2 sprays into both nostrils daily.  Marland Kitchen. ketoconazole (NIZORAL) 2 % cream Apply 1 application topically daily.  Marland Kitchen. lidocaine (XYLOCAINE) 2 % solution Apply very small amount to affect area three to four times daily as needed  . losartan (COZAAR) 100 MG tablet Take 1 tablet (100 mg total) by mouth daily.  Marland Kitchen. nystatin (MYCOSTATIN/NYSTOP) powder Apply topically 3 (three) times daily.  Marland Kitchen. omeprazole (PRILOSEC) 40 MG capsule Take 1 capsule (40 mg total) by mouth daily.  . traZODone (DESYREL) 50 MG tablet Take 0.5-1 tablets (25-50 mg total) by mouth at bedtime as needed for sleep.   No facility-administered encounter medications on file as of 09/08/2018.       Medical History: Past Medical History:  Diagnosis Date  . Acid reflux   . HTN (hypertension)   . Nocturia   . Overweight   . Urinary frequency      Vital Signs: BP (!) 147/76   Pulse 62   Temp (!) 96.2 F (35.7 C)   Ht 5\' 4"  (1.626 m)   Wt 201 lb (91.2 kg)   SpO2 97%   BMI 34.50 kg/m    Review of Systems   Constitutional: Negative for chills, fatigue and unexpected weight change.  HENT: Negative for congestion, rhinorrhea, sneezing and sore throat.   Eyes: Negative for photophobia, pain and redness.  Respiratory: Negative for cough, chest tightness and shortness of breath.   Cardiovascular: Negative for chest pain and palpitations.  Gastrointestinal: Negative for abdominal pain, constipation, diarrhea, nausea and vomiting.  Endocrine: Negative.   Genitourinary: Negative for dysuria and frequency.       Groin pain.  Musculoskeletal: Negative for arthralgias, back pain, joint swelling and neck pain.  Skin: Negative for rash.  Allergic/Immunologic: Negative.   Neurological: Negative for tremors and numbness.  Hematological: Negative for adenopathy. Does not bruise/bleed easily.  Psychiatric/Behavioral: Negative for behavioral problems and sleep disturbance. The patient is not nervous/anxious.     Physical Exam Vitals signs and nursing note reviewed.  Constitutional:      General: She is not in acute distress.    Appearance: She is well-developed. She is not diaphoretic.  HENT:     Head: Normocephalic and atraumatic.     Mouth/Throat:     Pharynx: No oropharyngeal exudate.  Eyes:     Pupils: Pupils are equal, round, and reactive to light.  Neck:     Musculoskeletal: Normal range of motion and neck supple.     Thyroid: No thyromegaly.  Vascular: No JVD.     Trachea: No tracheal deviation.  Cardiovascular:     Rate and Rhythm: Normal rate and regular rhythm.     Heart sounds: Normal heart sounds. No murmur. No friction rub. No gallop.   Pulmonary:     Effort: Pulmonary effort is normal. No respiratory distress.     Breath sounds: Normal breath sounds. No wheezing or rales.  Chest:     Chest wall: No tenderness.  Abdominal:     Palpations: Abdomen is soft.     Tenderness: There is no abdominal tenderness. There is no guarding.  Musculoskeletal: Normal range of motion.   Lymphadenopathy:     Cervical: No cervical adenopathy.  Skin:    General: Skin is warm and dry.  Neurological:     Mental Status: She is alert and oriented to person, place, and time.     Cranial Nerves: No cranial nerve deficit.  Psychiatric:        Behavior: Behavior normal.        Thought Content: Thought content normal.        Judgment: Judgment normal.    Assessment/Plan: 1. Groin pain, left Continue to use Nystatin and ketoconazole cream.  RTC if new or worsening symptoms.  2. Benign essential HTN Stable, continue present management.   3. Gastroesophageal reflux disease without esophagitis Stable, continue to use Prilosec as directed.   General Counseling: rubee vega understanding of the findings of todays visit and agrees with plan of treatment. I have discussed any further diagnostic evaluation that may be needed or ordered today. We also reviewed her medications today. she has been encouraged to call the office with any questions or concerns that should arise related to todays visit.   No orders of the defined types were placed in this encounter.   No orders of the defined types were placed in this encounter.   Time spent: 25 Minutes  This patient was seen by Orson Gear AGNP-C in Collaboration with Dr Lavera Guise as a part of collaborative care agreement.  Kendell Bane AGNP-C Internal Medicine

## 2018-09-29 ENCOUNTER — Encounter: Payer: Self-pay | Admitting: Obstetrics and Gynecology

## 2018-09-29 ENCOUNTER — Ambulatory Visit (INDEPENDENT_AMBULATORY_CARE_PROVIDER_SITE_OTHER): Payer: Managed Care, Other (non HMO) | Admitting: Obstetrics and Gynecology

## 2018-09-29 ENCOUNTER — Other Ambulatory Visit: Payer: Self-pay

## 2018-09-29 VITALS — BP 137/79 | HR 78 | Ht 64.0 in | Wt 198.7 lb

## 2018-09-29 DIAGNOSIS — B373 Candidiasis of vulva and vagina: Secondary | ICD-10-CM | POA: Diagnosis not present

## 2018-09-29 DIAGNOSIS — B3731 Acute candidiasis of vulva and vagina: Secondary | ICD-10-CM

## 2018-09-29 DIAGNOSIS — R102 Pelvic and perineal pain: Secondary | ICD-10-CM

## 2018-09-29 MED ORDER — FLUCONAZOLE 150 MG PO TABS: 150.0000 mg | ORAL_TABLET | ORAL | 0 refills | Status: DC

## 2018-09-29 NOTE — Progress Notes (Signed)
Patient comes in today for left side groin pain since March. Patient denies any urinary symptoms.

## 2018-09-29 NOTE — Progress Notes (Signed)
HPI:      Margaret Lynch is a 53 y.o. No obstetric history on file. who LMP was No LMP recorded. Patient is postmenopausal.  Subjective:   She presents today with complaint of intermittent "groin pain" since March.  She states that this happens a few times per week.  Each episode lasts approximately 3 seconds.  It resolves without further problems that day.  She says it does not disable her or prevent her from performing normal daily activities.  She says that she does nothing to treat it and she cannot cause it to happen.  It is not related to bowel bladder function.  It is not worse at the end of the day. She does state that she has occasional itching of the vulva.    Hx: The following portions of the patient's history were reviewed and updated as appropriate:             She  has a past medical history of Acid reflux, HTN (hypertension), Nocturia, Overweight, and Urinary frequency. She does not have any pertinent problems on file. She  has no past surgical history on file. Her family history includes Diabetes in her mother; Esophageal cancer in her father; Hypertension in her mother. She  reports that she has never smoked. She has never used smokeless tobacco. She reports that she does not drink alcohol or use drugs. She has a current medication list which includes the following prescription(s): atenolol, fluticasone, ketoconazole, losartan, fluconazole, lidocaine, nystatin, omeprazole, and trazodone. She is allergic to isovue [iopamidol].       Review of Systems:  Review of Systems  Constitutional: Denied constitutional symptoms, night sweats, recent illness, fatigue, fever, insomnia and weight loss.  Eyes: Denied eye symptoms, eye pain, photophobia, vision change and visual disturbance.  Ears/Nose/Throat/Neck: Denied ear, nose, throat or neck symptoms, hearing loss, nasal discharge, sinus congestion and sore throat.  Cardiovascular: Denied cardiovascular symptoms, arrhythmia,  chest pain/pressure, edema, exercise intolerance, orthopnea and palpitations.  Respiratory: Denied pulmonary symptoms, asthma, pleuritic pain, productive sputum, cough, dyspnea and wheezing.  Gastrointestinal: Denied, gastro-esophageal reflux, melena, nausea and vomiting.  Genitourinary: See HPI for additional information.  Musculoskeletal: Denied musculoskeletal symptoms, stiffness, swelling, muscle weakness and myalgia.  Dermatologic: Denied dermatology symptoms, rash and scar.  Neurologic: Denied neurology symptoms, dizziness, headache, neck pain and syncope.  Psychiatric: Denied psychiatric symptoms, anxiety and depression.  Endocrine: Denied endocrine symptoms including hot flashes and night sweats.   Meds:   Current Outpatient Medications on File Prior to Visit  Medication Sig Dispense Refill  . atenolol (TENORMIN) 25 MG tablet Take 1 tablet (25 mg total) by mouth at bedtime. 90 tablet 1  . fluticasone (FLONASE) 50 MCG/ACT nasal spray Place 2 sprays into both nostrils daily. 48 g 3  . ketoconazole (NIZORAL) 2 % cream Apply 1 application topically daily. 30 g 1  . losartan (COZAAR) 100 MG tablet Take 1 tablet (100 mg total) by mouth daily. 90 tablet 3  . lidocaine (XYLOCAINE) 2 % solution Apply very small amount to affect area three to four times daily as needed (Patient not taking: Reported on 09/29/2018) 15 mL 0  . nystatin (MYCOSTATIN/NYSTOP) powder Apply topically 3 (three) times daily. (Patient not taking: Reported on 09/29/2018) 60 g 1  . omeprazole (PRILOSEC) 40 MG capsule Take 1 capsule (40 mg total) by mouth daily. (Patient not taking: Reported on 09/29/2018) 30 capsule 5  . traZODone (DESYREL) 50 MG tablet Take 0.5-1 tablets (25-50 mg total) by mouth at  bedtime as needed for sleep. (Patient not taking: Reported on 09/29/2018) 30 tablet 3   No current facility-administered medications on file prior to visit.     Objective:     Vitals:   09/29/18 1036  BP: 137/79  Pulse: 78               Physical examination   Pelvic:   Vulva:  Labia is erythematous and somewhat shiny consistent with monilia.  No lesions.  Vagina: No lesions or abnormalities noted.  Moderate vaginal atrophy  Support: Normal pelvic support.  Urethra No masses tenderness or scarring.  Meatus Normal size without lesions or prolapse.  Cervix: Normal appearance.  No lesions.  Anus: Normal exam.  No lesions.  Perineum: Normal exam.  No lesions.        Bimanual   Uterus: Normal size.  Non-tender.  Mobile.  AV.  Adnexae: No masses.  Non-tender to palpation.  Cul-de-sac: Negative for abnormality.   Abdominal examination reveals no masses no evidence of hernia Patient is asked to point to the area of pain and she points to the left labia. On examination I cannot reproduce the pain and she cannot reproduce the pain.  There are no masses palpated in the area in question.  Assessment:    No obstetric history on file. Patient Active Problem List   Diagnosis Date Noted  . Cutaneous candidiasis 07/12/2018  . Furuncle of groin 07/05/2018  . Screening for breast cancer 12/24/2017  . Gastroesophageal reflux disease without esophagitis 12/24/2017  . Abnormal weight gain 12/24/2017  . Dysuria 12/24/2017  . Moderate obesity 06/07/2017  . Non-ulcerative nasal mucositis 04/21/2017  . 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 Benign essential HTN 11/12/2014  . Dyspnea on exertion 11/06/2014  . H/O syncope 11/06/2014  . Intermittent palpitations 11/06/2014  . Urinary frequency 10/08/2014  . Nocturia 10/08/2014     1. Monilial vulvitis   2. Vulvar pain     Possibly secondary to cutaneous monilia on the labia  Otherwise no other abnormalities are palpated.   Plan:            1.  Diflucan  2.  Expectant management of labial pain Orders No orders of the defined types were placed in this encounter.    Meds ordered this encounter  Medications  . fluconazole (DIFLUCAN) 150 MG tablet    Sig: Take 1 tablet  (150 mg total) by mouth every 3 (three) days. For 4 doses    Dispense:  4 tablet    Refill:  0      F/U  Return for Pt to contact us if symptoms worsen. I spent 32 minutes involved in the care of this patient of which greater than 50% was spent discussing labial pain, vulvar itching, chronic intermittent nature of the pain, future work-up if necessary.  All questions answered  Finis Bud, M.D. 09/29/2018 11:11 AM

## 2018-10-03 ENCOUNTER — Other Ambulatory Visit: Payer: Self-pay

## 2018-10-19 ENCOUNTER — Other Ambulatory Visit: Payer: Self-pay

## 2018-10-19 DIAGNOSIS — I10 Essential (primary) hypertension: Secondary | ICD-10-CM

## 2018-10-19 MED ORDER — ATENOLOL 25 MG PO TABS: 25.0000 mg | ORAL_TABLET | Freq: Every day | ORAL | 1 refills | Status: DC

## 2018-11-13 ENCOUNTER — Ambulatory Visit: Payer: Managed Care, Other (non HMO) | Admitting: Nurse Practitioner

## 2018-11-21 ENCOUNTER — Ambulatory Visit: Payer: Managed Care, Other (non HMO) | Admitting: Nurse Practitioner

## 2018-11-21 ENCOUNTER — Encounter: Payer: Self-pay | Admitting: Nurse Practitioner

## 2018-11-21 ENCOUNTER — Other Ambulatory Visit: Payer: Self-pay

## 2018-11-21 VITALS — BP 148/86 | HR 78 | Temp 97.6°F | Resp 16 | Ht 64.0 in | Wt 203.6 lb

## 2018-11-21 DIAGNOSIS — I10 Essential (primary) hypertension: Secondary | ICD-10-CM | POA: Diagnosis not present

## 2018-11-21 DIAGNOSIS — B3731 Acute candidiasis of vulva and vagina: Secondary | ICD-10-CM | POA: Insufficient documentation

## 2018-11-21 DIAGNOSIS — B372 Candidiasis of skin and nail: Secondary | ICD-10-CM | POA: Diagnosis not present

## 2018-11-21 DIAGNOSIS — B373 Candidiasis of vulva and vagina: Secondary | ICD-10-CM | POA: Insufficient documentation

## 2018-11-21 MED ORDER — KETOCONAZOLE 2 % EX CREA: 1.0000 "application " | TOPICAL_CREAM | Freq: Every day | CUTANEOUS | 5 refills | Status: DC

## 2018-11-21 MED ORDER — FLUCONAZOLE 150 MG PO TABS: ORAL_TABLET | ORAL | 5 refills | Status: DC

## 2018-11-21 NOTE — Progress Notes (Signed)
[facility] [place], [place] [address]  Internal MEDICINE  Office Visit Note  Patient Name: Margaret Lynch  [id number]  [id number]  Date of Service: 11/21/2018  Chief Complaint  Patient presents with  . Hypertension  . Gastroesophageal Reflux  . Vaginitis    swollen on left side and feels irritated    The patient continues to have rash which is very irritated on the right side of labia. Now spreading to the left labia and center of the vulva. Initially, started with antibiotics, thinking it was infected hair follicle. Changed this to nystatin cream when able to visually examine the area. Appeared to be more of fungal type rash. Has been using nystatin cream since her last visit. She states the area still feels very irritated and even swollen. Hurts to sit for any length of time. Has tried ketoconazole cream. When that didn't work, she saw GYN provider. He diagnosed her with chronic yeast infection. Started on on diflucan every three days for total of four doses. She states this helped more than anything, but symptoms did come right back. She has noted some white colored, thick discharge, which has no odor to it.       Current Medication: Outpatient Encounter Medications as of 11/21/2018  Medication Sig  . atenolol (TENORMIN) 25 MG tablet Take 1 tablet (25 mg total) by mouth at bedtime.  . fluticasone (FLONASE) 50 MCG/ACT nasal spray Place 2 sprays into both nostrils daily.  Marland Kitchen ketoconazole (NIZORAL) 2 % cream Apply 1 application topically daily.  Marland Kitchen lidocaine (XYLOCAINE) 2 % solution Apply very small amount to affect area three to four times daily as needed  . losartan (COZAAR) 100 MG tablet Take 1 tablet (100 mg total) by mouth daily.  Marland Kitchen nystatin (MYCOSTATIN/NYSTOP) powder Apply topically 3 (three) times daily.  Marland Kitchen omeprazole (PRILOSEC) 40 MG capsule Take 1 capsule (40 mg total) by mouth daily.  . traZODone (DESYREL) 50 MG tablet Take 0.5-1 tablets (25-50  mg total) by mouth at bedtime as needed for sleep.  . [DISCONTINUED] ketoconazole (NIZORAL) 2 % cream Apply 1 application topically daily.  . fluconazole (DIFLUCAN) 150 MG tablet Take 1 tablet po once weekly for persistent yeast infection.  . [DISCONTINUED] fluconazole (DIFLUCAN) 150 MG tablet Take 1 tablet (150 mg total) by mouth every 3 (three) days. For 4 doses (Patient not taking: Reported on 11/21/2018)   No facility-administered encounter medications on file as of 11/21/2018.     Surgical History: History reviewed. No pertinent surgical history.  Medical History: Past Medical History:  Diagnosis Date  . Acid reflux   . HTN (hypertension)   . Nocturia   . Overweight   . Urinary frequency     Family History: Family History  Problem Relation Age of Onset  . Hypertension Mother   . Diabetes Mother   . Esophageal cancer Father   . Kidney disease Neg Hx   . Prostate cancer Neg Hx   . Bladder Cancer Neg Hx   . Breast cancer Neg Hx     Social History   Socioeconomic History  . Marital status: Single    Spouse name: Not on file  . Number of children: Not on file  . Years of education: Not on file  . Highest education level: Not on file  Occupational History  . Not on file  Social Needs  . Financial resource strain: Not on file  . Food insecurity    Worry: Not on file  Inability: Not on file  . Transportation needs    Medical: Not on file    Non-medical: Not on file  Tobacco Use  . Smoking status: Never Smoker  . Smokeless tobacco: Never Used  Substance and Sexual Activity  . Alcohol use: No    Alcohol/week: 0.0 standard drinks  . Drug use: No  . Sexual activity: Not on file  Lifestyle  . Physical activity    Days per week: Not on file    Minutes per session: Not on file  . Stress: Not on file  Relationships  . Social Musicianconnections    Talks on phone: Not on file    Gets together: Not on file    Attends religious service: Not on file    Active member of  club or organization: Not on file    Attends meetings of clubs or organizations: Not on file    Relationship status: Not on file  . Intimate partner violence    Fear of current or ex partner: Not on file    Emotionally abused: Not on file    Physically abused: Not on file    Forced sexual activity: Not on file  Other Topics Concern  . Not on file  Social History Narrative  . Not on file      Review of Systems  Constitutional: Negative for activity change, chills, fatigue and unexpected weight change.  HENT: Negative for congestion, postnasal drip, rhinorrhea, sneezing and sore throat.   Respiratory: Negative for cough, chest tightness, shortness of breath and wheezing.   Cardiovascular: Negative for chest pain and palpitations.       Blood pressure slightly elevated today.  Gastrointestinal: Negative for abdominal pain, constipation, diarrhea, nausea and vomiting.  Genitourinary: Positive for vaginal discharge. Negative for dysuria and frequency.       Rash and vaginal irritation. Started along left labia. Now on right labia and in the vulva. Has had small amount of white discharge.  Musculoskeletal: Negative for arthralgias, back pain, joint swelling and neck pain.  Skin: Negative for rash.  Neurological: Negative for dizziness, tremors, numbness and headaches.  Hematological: Negative for adenopathy. Does not bruise/bleed easily.  Psychiatric/Behavioral: Negative for behavioral problems (Depression), sleep disturbance and suicidal ideas. The patient is not nervous/anxious.     Today's Vitals   11/21/18 1037  BP: (!) 148/86  Pulse: 78  Resp: 16  Temp: 97.6 F (36.4 C)  SpO2: 97%  Weight: 203 lb 9.6 oz (92.4 kg)  Height: 5\' 4"  (1.626 m)   Body mass index is 34.95 kg/m.  Physical Exam Vitals signs and nursing note reviewed.  Constitutional:      General: She is not in acute distress.    Appearance: Normal appearance. She is well-developed. She is obese. She is not  diaphoretic.  HENT:     Head: Normocephalic and atraumatic.     Mouth/Throat:     Pharynx: No oropharyngeal exudate.  Eyes:     Pupils: Pupils are equal, round, and reactive to light.  Neck:     Musculoskeletal: Normal range of motion and neck supple.     Thyroid: No thyromegaly.     Vascular: No JVD.     Trachea: No tracheal deviation.  Cardiovascular:     Rate and Rhythm: Normal rate and regular rhythm.     Heart sounds: Normal heart sounds. No murmur. No friction rub. No gallop.   Pulmonary:     Effort: Pulmonary effort is normal. No respiratory distress.  Breath sounds: Normal breath sounds. No wheezing or rales.  Chest:     Chest wall: No tenderness.  Abdominal:     General: Bowel sounds are normal.     Palpations: Abdomen is soft.     Tenderness: There is no abdominal tenderness.  Genitourinary:    Comments: Redness and irritation along the labia and groin, bilaterally. Vulval also appears irritated. Currently no focal lesion. Skin intact. No discharge noted at this time.  Musculoskeletal: Normal range of motion.  Lymphadenopathy:     Cervical: No cervical adenopathy.  Skin:    General: Skin is warm and dry.  Neurological:     Mental Status: She is alert and oriented to person, place, and time.     Cranial Nerves: No cranial nerve deficit.  Psychiatric:        Behavior: Behavior normal.        Thought Content: Thought content normal.        Judgment: Judgment normal.    Assessment/Plan: 1. Vaginal candidiasis Persistent. Start diflucan 150mg  weekly for next six months.  - fluconazole (DIFLUCAN) 150 MG tablet; Take 1 tablet po once weekly for persistent yeast infection.  Dispense: 5 tablet; Refill: 5  2. Cutaneous candidiasis Continue to use ketoconazole cream daily as needed.  - ketoconazole (NIZORAL) 2 % cream; Apply 1 application topically daily.  Dispense: 30 g; Refill: 5  3. Benign essential HTN Stable. Continue bp medication as prescribed .  General  Counseling: jacqulin brandenburger understanding of the findings of todays visit and agrees with plan of treatment. I have discussed any further diagnostic evaluation that may be needed or ordered today. We also reviewed her medications today. she has been encouraged to call the office with any questions or concerns that should arise related to todays visit.  This patient was seen by [name] FNP Collaboration with Dr Vincent Gros as a part of collaborative care agreement  Meds ordered this encounter  Medications  . fluconazole (DIFLUCAN) 150 MG tablet    Sig: Take 1 tablet po once weekly for persistent yeast infection.    Dispense:  5 tablet    Refill:  5    Order Specific Question:   Supervising Provider    Answer:   Lyndon Code [[id number]]  . ketoconazole (NIZORAL) 2 % cream    Sig: Apply 1 application topically daily.    Dispense:  30 g    Refill:  5    Order Specific Question:   Supervising Provider    Answer:   Lyndon Code [[id number]]    Time spent: 98 Minutes      Dr 22 Internal medicine

## 2018-12-15 ENCOUNTER — Telehealth: Payer: Self-pay

## 2018-12-15 NOTE — Telephone Encounter (Signed)
LMOM FOR PATIENT TO CONFIRM AND DO COVID SCREENING FOR 12-19-18 OV.

## 2018-12-19 ENCOUNTER — Encounter: Payer: Self-pay | Admitting: Nurse Practitioner

## 2018-12-19 ENCOUNTER — Ambulatory Visit (INDEPENDENT_AMBULATORY_CARE_PROVIDER_SITE_OTHER): Payer: Managed Care, Other (non HMO) | Admitting: Nurse Practitioner

## 2018-12-19 ENCOUNTER — Other Ambulatory Visit: Payer: Self-pay

## 2018-12-19 VITALS — BP 140/84 | HR 80 | Temp 97.7°F | Resp 16 | Ht 64.0 in | Wt 202.0 lb

## 2018-12-19 DIAGNOSIS — N390 Urinary tract infection, site not specified: Secondary | ICD-10-CM | POA: Diagnosis not present

## 2018-12-19 DIAGNOSIS — R300 Dysuria: Secondary | ICD-10-CM

## 2018-12-19 DIAGNOSIS — Z1231 Encounter for screening mammogram for malignant neoplasm of breast: Secondary | ICD-10-CM

## 2018-12-19 DIAGNOSIS — Z112 Encounter for screening for other bacterial diseases: Secondary | ICD-10-CM

## 2018-12-19 DIAGNOSIS — I10 Essential (primary) hypertension: Secondary | ICD-10-CM | POA: Diagnosis not present

## 2018-12-19 DIAGNOSIS — Z0001 Encounter for general adult medical examination with abnormal findings: Secondary | ICD-10-CM | POA: Diagnosis not present

## 2018-12-19 DIAGNOSIS — Z124 Encounter for screening for malignant neoplasm of cervix: Secondary | ICD-10-CM | POA: Diagnosis not present

## 2018-12-19 DIAGNOSIS — Z1211 Encounter for screening for malignant neoplasm of colon: Secondary | ICD-10-CM

## 2018-12-19 MED ORDER — ATENOLOL 25 MG PO TABS: 25.0000 mg | ORAL_TABLET | Freq: Every day | ORAL | 1 refills | Status: DC

## 2018-12-19 NOTE — Progress Notes (Signed)
[facility] [place], [place] [address]  Internal MEDICINE  Office Visit Note  Patient Name: Margaret Lynch  [id number]  [id number]  Date of Service: 12/31/2018   Pt is here for routine health maintenance examination  Chief Complaint  Patient presents with  . Annual Exam  . Hypertension  . Gastroesophageal Reflux     The patient is here for health maintenance exam with pap smear. The patient continues to have rash bilateral groin area.  Appeared to be more of fungal type rash. Several topical treatments were tried. When they didn't work, she saw GYN provider. He diagnosed her with chronic yeast infection. Started on on diflucan every three days for total of four doses. She states this helped more than anything, but symptoms did come right back. She was then treated with diflucan 150mg  weekly for several weeks, using nystatin cream topically for symptoms.  She has noted some white colored, thick discharge, which has no odor to it. Her blood pressure is well controlled. She is due to have screening mammogram.       Current Medication: Outpatient Encounter Medications as of 12/19/2018  Medication Sig  . atenolol (TENORMIN) 25 MG tablet Take 1 tablet (25 mg total) by mouth at bedtime.  . fluconazole (DIFLUCAN) 150 MG tablet Take 1 tablet po once weekly for persistent yeast infection.  . fluticasone (FLONASE) 50 MCG/ACT nasal spray Place 2 sprays into both nostrils daily.  Marland Kitchen ketoconazole (NIZORAL) 2 % cream Apply 1 application topically daily.  Marland Kitchen lidocaine (XYLOCAINE) 2 % solution Apply very small amount to affect area three to four times daily as needed  . losartan (COZAAR) 100 MG tablet Take 1 tablet (100 mg total) by mouth daily.  Marland Kitchen nystatin (MYCOSTATIN/NYSTOP) powder Apply topically 3 (three) times daily.  Marland Kitchen omeprazole (PRILOSEC) 40 MG capsule Take 1 capsule (40 mg total) by mouth daily.  . traZODone (DESYREL) 50 MG tablet Take 0.5-1 tablets (25-50 mg  total) by mouth at bedtime as needed for sleep.  . [DISCONTINUED] atenolol (TENORMIN) 25 MG tablet Take 1 tablet (25 mg total) by mouth at bedtime.  . sulfamethoxazole-trimethoprim (BACTRIM DS) 800-160 MG tablet Take 1 tablet by mouth 2 (two) times daily.   No facility-administered encounter medications on file as of 12/19/2018.     Surgical History: History reviewed. No pertinent surgical history.  Medical History: Past Medical History:  Diagnosis Date  . Acid reflux   . HTN (hypertension)   . Nocturia   . Overweight   . Urinary frequency     Family History: Family History  Problem Relation Age of Onset  . Hypertension Mother   . Diabetes Mother   . Esophageal cancer Father   . Kidney disease Neg Hx   . Prostate cancer Neg Hx   . Bladder Cancer Neg Hx   . Breast cancer Neg Hx       Review of Systems  Constitutional: Negative for activity change, chills, fatigue and unexpected weight change.  HENT: Negative for congestion, postnasal drip, rhinorrhea, sneezing and sore throat.   Respiratory: Negative for cough, chest tightness, shortness of breath and wheezing.   Cardiovascular: Negative for chest pain and palpitations.       Blood pressure doing well.   Gastrointestinal: Negative for abdominal pain, constipation, diarrhea, nausea and vomiting.  Endocrine: Negative for cold intolerance, heat intolerance, polydipsia and polyuria.  Genitourinary: Positive for vaginal discharge. Negative for dysuria, flank pain, frequency, hematuria and pelvic pain.  Rash and vaginal irritation. Started along left labia. Now on right labia and in the vulva. Has had small amount of white discharge.  Musculoskeletal: Negative for arthralgias, back pain, joint swelling and neck pain.  Skin: Negative for rash.  Allergic/Immunologic: Negative for environmental allergies.  Neurological: Negative for dizziness, tremors, numbness and headaches.  Hematological: Negative for adenopathy. Does  not bruise/bleed easily.  Psychiatric/Behavioral: Negative for behavioral problems (Depression), dysphoric mood, sleep disturbance and suicidal ideas. The patient is not nervous/anxious.      Today's Vitals   12/19/18 1403  BP: 140/84  Pulse: 80  Resp: 16  Temp: 97.7 F (36.5 C)  SpO2: 98%  Weight: 202 lb (91.6 kg)  Height: 5\' 4"  (1.626 m)   Body mass index is 34.67 kg/m.  Physical Exam Vitals signs and nursing note reviewed.  Constitutional:      General: She is not in acute distress.    Appearance: Normal appearance. She is well-developed. She is obese. She is not diaphoretic.  HENT:     Head: Normocephalic and atraumatic.     Nose: Nose normal.     Mouth/Throat:     Pharynx: No oropharyngeal exudate.  Eyes:     Extraocular Movements: Extraocular movements intact.     Pupils: Pupils are equal, round, and reactive to light.  Neck:     Musculoskeletal: Normal range of motion and neck supple.     Thyroid: No thyromegaly.     Vascular: No JVD.     Trachea: No tracheal deviation.  Cardiovascular:     Rate and Rhythm: Normal rate and regular rhythm.     Pulses: Normal pulses.     Heart sounds: Normal heart sounds. No murmur. No friction rub. No gallop.   Pulmonary:     Effort: Pulmonary effort is normal. No respiratory distress.     Breath sounds: Normal breath sounds. No wheezing or rales.  Chest:     Chest wall: No tenderness.     Breasts:        Right: Normal. No swelling, bleeding, inverted nipple, mass, nipple discharge, skin change or tenderness.        Left: Normal. No swelling, bleeding, inverted nipple, mass, nipple discharge, skin change or tenderness.  Abdominal:     General: Bowel sounds are normal.     Palpations: Abdomen is soft.     Tenderness: There is no abdominal tenderness.  Genitourinary:    Exam position: Supine.     Labia:        Right: Rash and tenderness present.        Left: Rash and tenderness present.      Vagina: Erythema present. No  vaginal discharge or tenderness.     Cervix: Discharge present. No cervical motion tenderness, friability or erythema.     Uterus: Normal. Not enlarged.      Adnexa: Right adnexa normal and left adnexa normal.     Comments: Redness and irritation along the labia and groin, bilaterally. Vulval also appears irritated. Currently no focal lesion. Skin intact. No discharge noted at this time.  No tenderness, masses, or organomeglay present during bimanual exam . Musculoskeletal: Normal range of motion.  Lymphadenopathy:     Cervical: No cervical adenopathy.     Upper Body:     Right upper body: No axillary adenopathy.     Left upper body: No axillary adenopathy.     Lower Body: No right inguinal adenopathy. No left inguinal adenopathy.  Skin:  General: Skin is warm and dry.  Neurological:     Mental Status: She is alert and oriented to person, place, and time.     Cranial Nerves: No cranial nerve deficit.  Psychiatric:        Behavior: Behavior normal.        Thought Content: Thought content normal.        Judgment: Judgment normal.    LABS: Recent Results (from the past 2160 hour(s))  Pap IG and HPV (high risk) DNA detection     Status: None   Collection Time: 12/19/18  2:13 PM  Result Value Ref Range   Interpretation UNS     Comment: UNSATISFACTORY FOR EVALUATION.   Category UNS     Comment: Unsatisfactory   Adequacy INADB     Comment: Specimen processed and examined, but unsatisfactory for evaluation of epithelial abnormality because of insufficient cellularity.    Clinician Provided ICD10 Comment     Comment: Z12.4   Performed by: Comment     Comment: Patrcia Dolly, Cytotechnologist (ASCP)   QC reviewed by: Comment     Comment: Threasa Heads, Cytotechnologist (ASCP)   Note: Comment     Comment: The Pap smear is a screening test designed to aid in the detection of premalignant and malignant conditions of the uterine cervix.  It is not a diagnostic procedure and  should not be used as the sole means of detecting cervical cancer.  Both false-positive and false-negative reports do occur.    Test Methodology Comment     Comment: This liquid based ThinPrep(R) pap test was screened with the use of an image guided system.    HPV, high-risk Negative Negative    Comment: This nucleic acid amplification high-risk HPV test detects thirteen high-risk types (16,18,31,33,35,39,45,51,52,56,58,59,68) without differentiation. **EFFECTIVE February 05, 2019 THIS TEST WILL BE MADE NON-ORDERABLE. SEE [facility] DIRECTORY OF SERVICES FOR ALTERNATIVES TO THE HIGH RISK HPV TEST. ANY PROFILE THAT INCLUDES THIS TEST WILL ALSO BE MADE NON-ORDERABLE.**   urinalysis/with culture     Status: Abnormal   Collection Time: 12/19/18  2:13 PM   Specimen: Genital   GENITAL  Result Value Ref Range   Specific Gravity, UA 1.022 1.005 - 1.030   pH, UA 7.0 5.0 - 7.5   Color, UA Yellow Yellow   Appearance Ur Clear Clear   Leukocytes,UA 2+ (A) Negative   Protein,UA Negative Negative/Trace   Glucose, UA Negative Negative   Ketones, UA Negative Negative   RBC, UA Negative Negative   Bilirubin, UA Negative Negative   Urobilinogen, Ur 0.2 0.2 - 1.0 mg/dL   Nitrite, UA Negative Negative   Microscopic Examination See below:     Comment: Microscopic was indicated and was performed.   Urinalysis Reflex Comment     Comment: This specimen has reflexed to a Urine Culture.  NuSwab Vaginitis Plus (VG+)     Status: None   Collection Time: 12/19/18  2:13 PM  Result Value Ref Range   Atopobium vaginae Low - 0 Score   BVAB 2 Low - 0 Score   Megasphaera 1 Low - 0 Score    Comment: Calculate total score by adding the 3 individual bacterial vaginosis (BV) marker scores together.  Total score is interpreted as follows: Total score 0-1: Indicates the absence of BV. Total score   2: Indeterminate for BV. Additional clinical                  data should be evaluated to establish a  diagnosis. Total score 3-6: Indicates the presence of BV. This test was developed and its performance characteristics determined by [facility].  It has not been cleared or approved by the Food and Drug Administration.  The FDA has determined that such clearance or approval is not necessary.    Candida albicans, NAA Negative Negative   Candida glabrata, NAA Negative Negative   Trich vag by NAA Negative Negative   Chlamydia trachomatis, NAA Negative Negative   Neisseria gonorrhoeae, NAA Negative Negative  Microscopic Examination     Status: Abnormal   Collection Time: 12/19/18  2:13 PM   GENITAL  Result Value Ref Range   WBC, UA >30 (A) 0 - 5 /hpf    Comment: Clumps of leukocytes present.   RBC 3-10 (A) 0 - 2 /hpf   Epithelial Cells (non renal) 0-10 0 - 10 /hpf   Casts None seen None seen /lpf   Mucus, UA Present Not Estab.   Bacteria, UA Few None seen/Few  Urine Culture, Reflex     Status: Abnormal   Collection Time: 12/19/18  2:13 PM   GENITAL  Result Value Ref Range   Urine Culture, Routine Final report (A)    Organism ID, Bacteria Escherichia coli (A)     Comment: Greater than 100,000 colony forming units per mL   ORGANISM ID, BACTERIA Proteus mirabilis (A)     Comment: Greater than 100,000 colony forming units per mL Cefazolin <=4 ug/mL Cefazolin with an MIC <=16 predicts susceptibility to the oral agents cefaclor, cefdinir, cefpodoxime, cefprozil, cefuroxime, cephalexin, and loracarbef when used for therapy of uncomplicated urinary tract infections due to E. coli, Klebsiella pneumoniae, and Proteus mirabilis.    Antimicrobial Susceptibility Comment     Comment:       ** S = Susceptible; I = Intermediate; R = Resistant **                    P = Positive; N = Negative             MICS are expressed in micrograms per mL    Antibiotic                 RSLT#1    RSLT#2    RSLT#3    RSLT#4 Amoxicillin/Clavulanic Acid    R         S Ampicillin                     R          S Cefazolin                      R Cefepime                       S         S Ceftriaxone                    S         S Cefuroxime                     I         S Ciprofloxacin                  S         S Ertapenem  S         S Gentamicin                     S         S Imipenem                       S Levofloxacin                   S         S Meropenem                      S         S Nitrofurantoin                 I         R Piperacillin/Tazobactam        S         S Tetracycline                   I         R Tobramycin                     S         S Trimethoprim/Sulfa             S         S    Assessment/Plan: 1. Encounter for general adult medical examination with abnormal findings Annual health maintenance exam with pap smear today.  2. Benign essential HTN Stable. Continue atenolol and losartan as prescribed  - atenolol (TENORMIN) 25 MG tablet; Take 1 tablet (25 mg total) by mouth at bedtime.  Dispense: 90 tablet; Refill: 1  3. Urinary tract infection without hematuria, site unspecified Start bactrim DS bid for 10 days.  - sulfamethoxazole-trimethoprim (BACTRIM DS) 800-160 MG tablet; Take 1 tablet by mouth 2 (two) times daily.  Dispense: 20 tablet; Refill: 0  4. Encounter for screening examination for bacterial disease Vaginal swab taken to evaluate for bacterial and/or fungal infection. - NuSwab Vaginitis Plus (VG+)  5. Routine cervical smear - Pap IG and HPV (high risk) DNA detection  6. Encounter for screening mammogram for malignant neoplasm of breast - scrrening mammo; Future  7. Screening for colon cancer Refer to GI for screening colonoscopy.  - Ambulatory referral to Gastroenterology  8. Dysuria - urinalysis/with culture  General Counseling: [name] understanding of the findings of todays visit and agrees with plan of treatment. I have discussed any further diagnostic evaluation that may be needed or ordered today. We  also reviewed her medications today. she has been encouraged to call the office with any questions or concerns that should arise related to todays visit.    Counseling:  This patient was seen by Vincent Gros FNP Collaboration with Dr Lyndon Code as a part of collaborative care agreement  Orders Placed This Encounter  Procedures  . Microscopic Examination  . Urine Culture, Reflex  . scrrening mammo  . urinalysis/with culture  . NuSwab Vaginitis Plus (VG+)  . Ambulatory referral to Gastroenterology    Meds ordered this encounter  Medications  . atenolol (TENORMIN) 25 MG tablet    Sig: Take 1 tablet (25 mg total) by mouth at bedtime.    Dispense:  90 tablet    Refill:  1    Order Specific Question:   Supervising  Provider    Answer:   Lyndon Code [[id number]]  . sulfamethoxazole-trimethoprim (BACTRIM DS) 800-160 MG tablet    Sig: Take 1 tablet by mouth 2 (two) times daily.    Dispense:  20 tablet    Refill:  0    Order Specific Question:   Supervising Provider    Answer:   Lyndon Code [[id number]]    Time spent: 12 Minutes      Lyndon Code, MD  Internal Medicine

## 2018-12-20 ENCOUNTER — Telehealth: Payer: Self-pay

## 2018-12-20 ENCOUNTER — Other Ambulatory Visit: Payer: Self-pay

## 2018-12-20 DIAGNOSIS — Z1211 Encounter for screening for malignant neoplasm of colon: Secondary | ICD-10-CM

## 2018-12-20 NOTE — Progress Notes (Signed)
Waiting for results of culture and sensitivity

## 2018-12-20 NOTE — Telephone Encounter (Signed)
Gastroenterology Pre-Procedure Review  Request Date: Monday 01/15/19 Requesting Physician: Dr. Vicente Males  PATIENT REVIEW QUESTIONS: The patient responded to the following health history questions as indicated:    1. Are you having any GI issues? no 2. Do you have a personal history of Polyps? no 3. Do you have a family history of Colon Cancer or Polyps? no 4. Diabetes Mellitus? no 5. Joint replacements in the past 12 months?no 6. Major health problems in the past 3 months?no 7. Any artificial heart valves, MVP, or defibrillator?no    MEDICATIONS & ALLERGIES:    Patient reports the following regarding taking any anticoagulation/antiplatelet therapy:   Plavix, Coumadin, Eliquis, Xarelto, Lovenox, Pradaxa, Brilinta, or Effient? no Aspirin? no  Patient confirms/reports the following medications:  Current Outpatient Medications  Medication Sig Dispense Refill  . atenolol (TENORMIN) 25 MG tablet Take 1 tablet (25 mg total) by mouth at bedtime. 90 tablet 1  . fluconazole (DIFLUCAN) 150 MG tablet Take 1 tablet po once weekly for persistent yeast infection. 5 tablet 5  . fluticasone (FLONASE) 50 MCG/ACT nasal spray Place 2 sprays into both nostrils daily. 48 g 3  . ketoconazole (NIZORAL) 2 % cream Apply 1 application topically daily. 30 g 5  . lidocaine (XYLOCAINE) 2 % solution Apply very small amount to affect area three to four times daily as needed 15 mL 0  . losartan (COZAAR) 100 MG tablet Take 1 tablet (100 mg total) by mouth daily. 90 tablet 3  . nystatin (MYCOSTATIN/NYSTOP) powder Apply topically 3 (three) times daily. 60 g 1  . omeprazole (PRILOSEC) 40 MG capsule Take 1 capsule (40 mg total) by mouth daily. 30 capsule 5  . traZODone (DESYREL) 50 MG tablet Take 0.5-1 tablets (25-50 mg total) by mouth at bedtime as needed for sleep. 30 tablet 3   No current facility-administered medications for this visit.     Patient confirms/reports the following allergies:  Allergies  Allergen  Reactions  . Isovue [Iopamidol] Hives    Pt states she had a prior CT Scan and broke out with hives immediately after contrast injection and was given benadryl to help her recover. SPM    No orders of the defined types were placed in this encounter.   AUTHORIZATION INFORMATION Primary Insurance: 1D#: Group #:  Secondary Insurance: 1D#: Group #:  SCHEDULE INFORMATION: Date: Monday 01/15/19 Time: Location:[facility]

## 2018-12-23 LAB — NUSWAB VAGINITIS PLUS (VG+)
Candida albicans, NAA: NEGATIVE
Candida glabrata, NAA: NEGATIVE
Chlamydia trachomatis, NAA: NEGATIVE
Neisseria gonorrhoeae, NAA: NEGATIVE
Trich vag by NAA: NEGATIVE

## 2018-12-24 LAB — UA/M W/RFLX CULTURE, ROUTINE
Bilirubin, UA: NEGATIVE
Glucose, UA: NEGATIVE
Ketones, UA: NEGATIVE
Nitrite, UA: NEGATIVE
Protein,UA: NEGATIVE
RBC, UA: NEGATIVE
Specific Gravity, UA: 1.022 (ref 1.005–1.030)
Urobilinogen, Ur: 0.2 mg/dL (ref 0.2–1.0)
pH, UA: 7 (ref 5.0–7.5)

## 2018-12-24 LAB — MICROSCOPIC EXAMINATION
Casts: NONE SEEN /lpf
WBC, UA: >30 /hpf — AB (ref 0–5)

## 2018-12-24 LAB — URINE CULTURE, REFLEX

## 2018-12-25 MED ORDER — SULFAMETHOXAZOLE-TRIMETHOPRIM 800-160 MG PO TABS: 1.0000 | ORAL_TABLET | Freq: Two times a day (BID) | ORAL | 0 refills | Status: DC

## 2018-12-25 NOTE — Progress Notes (Signed)
lmom to call us back

## 2018-12-25 NOTE — Progress Notes (Signed)
Please let the patient know that urine sample from physical showed infection. I have added bactrim DS bid for 10 days. I sent prescription to [facility] for her. Thanks

## 2018-12-26 ENCOUNTER — Other Ambulatory Visit: Payer: Self-pay | Admitting: Adult Health

## 2018-12-26 LAB — PAP IG AND HPV HIGH-RISK: HPV, high-risk: NEGATIVE

## 2018-12-27 ENCOUNTER — Telehealth: Payer: Self-pay

## 2018-12-27 NOTE — Progress Notes (Signed)
lmom several message for pt last 3 days I send Mychart message to pt today that we send bactrim to [facility] for UTI

## 2018-12-27 NOTE — Telephone Encounter (Signed)
lmom several message to pt call back regarding her urine result

## 2018-12-31 DIAGNOSIS — Z0001 Encounter for general adult medical examination with abnormal findings: Secondary | ICD-10-CM | POA: Insufficient documentation

## 2018-12-31 DIAGNOSIS — Z124 Encounter for screening for malignant neoplasm of cervix: Secondary | ICD-10-CM | POA: Insufficient documentation

## 2018-12-31 DIAGNOSIS — N390 Urinary tract infection, site not specified: Secondary | ICD-10-CM | POA: Insufficient documentation

## 2018-12-31 DIAGNOSIS — Z112 Encounter for screening for other bacterial diseases: Secondary | ICD-10-CM | POA: Insufficient documentation

## 2018-12-31 DIAGNOSIS — Z1211 Encounter for screening for malignant neoplasm of colon: Secondary | ICD-10-CM | POA: Insufficient documentation

## 2019-01-04 ENCOUNTER — Other Ambulatory Visit: Payer: Self-pay | Admitting: Nurse Practitioner

## 2019-01-05 LAB — LIPID PANEL W/O CHOL/HDL RATIO
Cholesterol, Total: 140 mg/dL (ref 100–199)
HDL: 46 mg/dL (ref >39)
LDL Chol Calc (NIH): 80 mg/dL (ref 0–99)
Triglycerides: 67 mg/dL (ref 0–149)
VLDL Cholesterol Cal: 14 mg/dL (ref 5–40)

## 2019-01-05 LAB — COMPREHENSIVE METABOLIC PANEL
ALT: 22 IU/L (ref 0–32)
AST: 19 IU/L (ref 0–40)
Albumin/Globulin Ratio: 1.7 (ref 1.2–2.2)
Albumin: 4.6 g/dL (ref 3.8–4.9)
Alkaline Phosphatase: 97 IU/L (ref 39–117)
BUN/Creatinine Ratio: 15 (ref 9–23)
BUN: 15 mg/dL (ref 6–24)
Bilirubin Total: 0.5 mg/dL (ref 0.0–1.2)
CO2: 20 mmol/L (ref 20–29)
Calcium: 9.7 mg/dL (ref 8.7–10.2)
Chloride: 100 mmol/L (ref 96–106)
Creatinine, Ser: 1.02 mg/dL — ABNORMAL HIGH (ref 0.57–1.00)
GFR calc Af Amer: 72 mL/min/{1.73_m2} (ref >59)
GFR calc non Af Amer: 62 mL/min/{1.73_m2} (ref >59)
Globulin, Total: 2.7 g/dL (ref 1.5–4.5)
Glucose: 102 mg/dL — ABNORMAL HIGH (ref 65–99)
Potassium: 4.9 mmol/L (ref 3.5–5.2)
Sodium: 137 mmol/L (ref 134–144)
Total Protein: 7.3 g/dL (ref 6.0–8.5)

## 2019-01-05 LAB — CBC
Hematocrit: 45 % (ref 34.0–46.6)
Hemoglobin: 15.2 g/dL (ref 11.1–15.9)
MCH: 31.2 pg (ref 26.6–33.0)
MCHC: 33.8 g/dL (ref 31.5–35.7)
MCV: 92 fL (ref 79–97)
Platelets: 221 10*3/uL (ref 150–450)
RBC: 4.87 x10E6/uL (ref 3.77–5.28)
RDW: 12 % (ref 11.7–15.4)
WBC: 5.3 10*3/uL (ref 3.4–10.8)

## 2019-01-05 LAB — VITAMIN D 25 HYDROXY (VIT D DEFICIENCY, FRACTURES): Vit D, 25-Hydroxy: 28.5 ng/mL — ABNORMAL LOW (ref 30.0–100.0)

## 2019-01-05 LAB — TSH: TSH: 1.88 u[IU]/mL (ref 0.450–4.500)

## 2019-01-05 LAB — T4, FREE: Free T4: 1.24 ng/dL (ref 0.82–1.77)

## 2019-01-10 NOTE — Progress Notes (Signed)
Please let the patient know that her labs show Mild vitamin d deficiency and Slight elevation of creatinine. Advise she increase water intake and take OTC vitamin D3 everyday. Other labs look good. Thanks

## 2019-01-11 ENCOUNTER — Other Ambulatory Visit
Admission: RE | Admit: 2019-01-11 | Discharge: 2019-01-11 | Disposition: A | Discharge status: Home / Self Care | Payer: Managed Care, Other (non HMO) | Source: Ambulatory Visit | Attending: Gastroenterology | Admitting: Gastroenterology

## 2019-01-11 DIAGNOSIS — Z20828 Contact with and (suspected) exposure to other viral communicable diseases: Secondary | ICD-10-CM | POA: Diagnosis not present

## 2019-01-11 DIAGNOSIS — Z01812 Encounter for preprocedural laboratory examination: Secondary | ICD-10-CM | POA: Insufficient documentation

## 2019-01-11 LAB — SARS CORONAVIRUS 2 (TAT 6-24 HRS): SARS Coronavirus 2: NEGATIVE

## 2019-01-12 ENCOUNTER — Encounter: Payer: Self-pay | Admitting: Gastroenterology

## 2019-01-15 ENCOUNTER — Ambulatory Visit
Admission: RE | Admit: 2019-01-15 | Discharge: 2019-01-15 | Disposition: A | Discharge status: Home / Self Care | Payer: Managed Care, Other (non HMO) | Attending: Gastroenterology | Admitting: Gastroenterology

## 2019-01-15 ENCOUNTER — Encounter
Admission: RE | Disposition: A | Discharge status: Home / Self Care | Payer: Self-pay | Source: Home / Self Care | Attending: Gastroenterology

## 2019-01-15 ENCOUNTER — Encounter: Payer: Self-pay | Admitting: Gastroenterology

## 2019-01-15 ENCOUNTER — Other Ambulatory Visit: Payer: Self-pay

## 2019-01-15 ENCOUNTER — Ambulatory Visit: Payer: Managed Care, Other (non HMO) | Admitting: Certified Registered"

## 2019-01-15 DIAGNOSIS — Z1211 Encounter for screening for malignant neoplasm of colon: Secondary | ICD-10-CM

## 2019-01-15 DIAGNOSIS — K219 Gastro-esophageal reflux disease without esophagitis: Secondary | ICD-10-CM | POA: Diagnosis not present

## 2019-01-15 DIAGNOSIS — K635 Polyp of colon: Secondary | ICD-10-CM

## 2019-01-15 DIAGNOSIS — Z79899 Other long term (current) drug therapy: Secondary | ICD-10-CM | POA: Diagnosis not present

## 2019-01-15 DIAGNOSIS — Z800 Family history of malignant neoplasm of digestive organs: Secondary | ICD-10-CM | POA: Insufficient documentation

## 2019-01-15 DIAGNOSIS — I10 Essential (primary) hypertension: Secondary | ICD-10-CM | POA: Diagnosis not present

## 2019-01-15 HISTORY — PX: COLONOSCOPY WITH PROPOFOL: SHX5780

## 2019-01-15 SURGERY — COLONOSCOPY WITH PROPOFOL
Anesthesia: General

## 2019-01-15 MED ORDER — LIDOCAINE 2% (20 MG/ML) 5 ML SYRINGE
INTRAMUSCULAR | Status: DC | PRN
  Administered 2019-01-15: 25 mg via INTRAVENOUS

## 2019-01-15 MED ORDER — MIDAZOLAM HCL 5 MG/5ML IJ SOLN
INTRAMUSCULAR | Status: DC | PRN
  Administered 2019-01-15: 2 mg via INTRAVENOUS

## 2019-01-15 MED ORDER — PROPOFOL 10 MG/ML IV BOLUS
INTRAVENOUS | Status: DC | PRN
  Administered 2019-01-15: 30 mg via INTRAVENOUS
  Administered 2019-01-15: 70 mg via INTRAVENOUS

## 2019-01-15 MED ORDER — PROPOFOL 500 MG/50ML IV EMUL
INTRAVENOUS | Status: DC | PRN
  Administered 2019-01-15: 120 ug/kg/min via INTRAVENOUS

## 2019-01-15 MED ORDER — SODIUM CHLORIDE 0.9 % IV SOLN
INTRAVENOUS | Status: DC
  Administered 2019-01-15: 1000 mL via INTRAVENOUS

## 2019-01-15 MED ORDER — MIDAZOLAM HCL 2 MG/2ML IJ SOLN
INTRAMUSCULAR | Status: AC
  Filled 2019-01-15: qty 2

## 2019-01-15 NOTE — H&P (Signed)
Wyline Mood, MD [address], [address], [place], [place], [address] [address], [place], [place], [address] Phone: [phone]  Fax: [phone]  Primary Care Physician:  Carlean Jews, NP   Pre-Procedure History & Physical: HPI:  Margaret Lynch is a 53 y.o. female is here for an colonoscopy.   Past Medical History:  Diagnosis Date  . Acid reflux   . HTN (hypertension)   . Nocturia   . Overweight   . Urinary frequency     Past Surgical History:  Procedure Laterality Date  . COLONOSCOPY WITH PROPOFOL      Prior to Admission medications   Medication Sig Start Date End Date Taking? Authorizing Provider  atenolol (TENORMIN) 25 MG tablet Take 1 tablet (25 mg total) by mouth at bedtime. 12/19/18  Yes Boscia, Kathlynn Grate, NP  losartan (COZAAR) 100 MG tablet Take 1 tablet (100 mg total) by mouth daily. 07/07/18  Yes Boscia, Kathlynn Grate, NP  fluconazole (DIFLUCAN) 150 MG tablet Take 1 tablet po once weekly for persistent yeast infection. 11/21/18   Carlean Jews, NP  fluticasone (FLONASE) 50 MCG/ACT nasal spray Place 2 sprays into both nostrils daily. 04/21/17   Carlean Jews, NP  ketoconazole (NIZORAL) 2 % cream Apply 1 application topically daily. 11/21/18   Carlean Jews, NP  lidocaine (XYLOCAINE) 2 % solution Apply very small amount to affect area three to four times daily as needed 06/22/18   Carlean Jews, NP  nystatin (MYCOSTATIN/NYSTOP) powder Apply topically 3 (three) times daily. 07/28/18   Carlean Jews, NP  omeprazole (PRILOSEC) 40 MG capsule Take 1 capsule (40 mg total) by mouth daily. 12/20/17   Carlean Jews, NP  sulfamethoxazole-trimethoprim (BACTRIM DS) 800-160 MG tablet Take 1 tablet by mouth 2 (two) times daily. 12/25/18   Carlean Jews, NP  traZODone (DESYREL) 50 MG tablet Take 0.5-1 tablets (25-50 mg total) by mouth at bedtime as needed for sleep. 04/25/17   Carlean Jews, NP    Allergies as of 12/21/2018 - Review  Complete 12/19/2018  Allergen Reaction Noted  . Isovue [iopamidol] Hives 01/27/2017    Family History  Problem Relation Age of Onset  . Hypertension Mother   . Diabetes Mother   . Esophageal cancer Father   . Kidney disease Neg Hx   . Prostate cancer Neg Hx   . Bladder Cancer Neg Hx   . Breast cancer Neg Hx     Social History   Socioeconomic History  . Marital status: Single    Spouse name: Not on file  . Number of children: Not on file  . Years of education: Not on file  . Highest education level: Not on file  Occupational History  . Not on file  Tobacco Use  . Smoking status: Never Smoker  . Smokeless tobacco: Never Used  Substance and Sexual Activity  . Alcohol use: No    Alcohol/week: 0.0 standard drinks  . Drug use: No  . Sexual activity: Not on file  Other Topics Concern  . Not on file  Social History Narrative  . Not on file   Social Determinants of Health   Financial Resource Strain:   . Difficulty of Paying Living Expenses: Not on file  Food Insecurity:   . Worried About Programme researcher, broadcasting/film/video in the Last Year: Not on file  . Ran Out of Food in the Last Year: Not on file  Transportation Needs:   .  Lack of Transportation (Medical): Not on file  . Lack of Transportation (Non-Medical): Not on file  Physical Activity:   . Days of Exercise per Week: Not on file  . Minutes of Exercise per Session: Not on file  Stress:   . Feeling of Stress : Not on file  Social Connections:   . Frequency of Communication with Friends and Family: Not on file  . Frequency of Social Gatherings with Friends and Family: Not on file  . Attends Religious Services: Not on file  . Active Member of Clubs or Organizations: Not on file  . Attends Archivist Meetings: Not on file  . Marital Status: Not on file  Intimate Partner Violence:   . Fear of Current or Ex-Partner: Not on file  . Emotionally Abused: Not on file  . Physically Abused: Not on file  . Sexually Abused:  Not on file    Review of Systems: See HPI, otherwise negative ROS  Physical Exam: BP (!) 140/94   Pulse 77   Temp (!) 97.4 F (36.3 C) (Skin)   Resp 20   Ht 5\' 4"  (1.626 m)   Wt 86.4 kg   SpO2 99%   BMI 32.68 kg/m  General:   Alert,  pleasant and cooperative in NAD Head:  Normocephalic and atraumatic. Neck:  Supple; no masses or thyromegaly. Lungs:  Clear throughout to auscultation, normal respiratory effort.    Heart:  +S1, +S2, Regular rate and rhythm, No edema. Abdomen:  Soft, nontender and nondistended. Normal bowel sounds, without guarding, and without rebound.   Neurologic:  Alert and  oriented x4;  grossly normal neurologically.  Impression/Plan: Margaret Lynch is here for an colonoscopy to be performed for Screening colonoscopy average risk   Risks, benefits, limitations, and alternatives regarding  colonoscopy have been reviewed with the patient.  Questions have been answered.  All parties agreeable.   Jonathon Bellows, MD  01/15/2019, 9:43 AM

## 2019-01-15 NOTE — Anesthesia Preprocedure Evaluation (Addendum)
Anesthesia Evaluation  Patient identified by MRN, date of birth, ID band Patient awake    Reviewed: Allergy & Precautions, H&P , NPO status , Patient's Chart, lab work & pertinent test results  Airway Mallampati: II  TM Distance: >3 FB     Dental  (+) Chipped   Pulmonary neg pulmonary ROS, neg shortness of breath, neg COPD, neg recent URI,           Cardiovascular hypertension, (-) angina  H/o syncope, DOE s/p Holter monitoring.  Normal stress echo 2016   Neuro/Psych negative neurological ROS  negative psych ROS   GI/Hepatic Neg liver ROS, GERD  Controlled,  Endo/Other  negative endocrine ROS  Renal/GU negative Renal ROS  negative genitourinary   Musculoskeletal   Abdominal   Peds  Hematology negative hematology ROS (+)   Anesthesia Other Findings Past Medical History: No date: Acid reflux No date: HTN (hypertension) No date: Nocturia No date: Overweight No date: Urinary frequency  Past Surgical History: No date: COLONOSCOPY WITH PROPOFOL     Reproductive/Obstetrics negative OB ROS                            Anesthesia Physical Anesthesia Plan  ASA: II  Anesthesia Plan: General   Post-op Pain Management:    Induction:   PONV Risk Score and Plan: Propofol infusion and TIVA  Airway Management Planned: Natural Airway and Nasal Cannula  Additional Equipment:   Intra-op Plan:   Post-operative Plan:   Informed Consent: I have reviewed the patients History and Physical, chart, labs and discussed the procedure including the risks, benefits and alternatives for the proposed anesthesia with the patient or authorized representative who has indicated his/her understanding and acceptance.     Dental Advisory Given  Plan Discussed with: Anesthesiologist  Anesthesia Plan Comments:         Anesthesia Quick Evaluation

## 2019-01-15 NOTE — Op Note (Signed)
[facility] Gastroenterology Patient Name: Margaret StallLinda Lynch Procedure Date: 01/15/2019 9:47 AM MRN: [id number] Account #: [id number] Date of Birth: 04/14/1965 Admit Type: Outpatient Age: [age] [facility] Gender: Female Note Status: Finalized Procedure:             Colonoscopy Indications:           Screening for colorectal malignant neoplasm Providers:             Wyline MoodKiran Corrado Hymon MD, MD Referring MD:          Clare GandyHunter Botts, CRNA (Referring MD) Medicines:             Monitored Anesthesia Care Complications:         No immediate complications. Procedure:             Pre-Anesthesia Assessment:                        - Prior to the procedure, a History and Physical was                         performed, and patient medications, allergies and                         sensitivities were reviewed. The patient's tolerance                         of previous anesthesia was reviewed.                        - The risks and benefits of the procedure and the                         sedation options and risks were discussed with the                         patient. All questions were answered and informed                         consent was obtained.                        - ASA Grade Assessment: II - A patient with mild                         systemic disease.                        After obtaining informed consent, the colonoscope was                         passed under direct vision. Throughout the procedure,                         the patient's blood pressure, pulse, and oxygen                         saturations were monitored continuously. The                         Colonoscope was introduced through the anus and  advanced to the the cecum, identified by the                         appendiceal orifice. The colonoscopy was performed                         with ease. The patient tolerated the procedure well.                         The quality  of the bowel preparation was excellent. Findings:      The perianal and digital rectal examinations were normal.      Three sessile polyps were found in the descending colon. The polyps were       4 to 5 mm in size. These polyps were removed with a cold snare.       Resection was complete, but the polyp tissue was only partially       retrieved.      The exam was otherwise without abnormality.      The retroflexed view of the distal rectum and anal verge was normal and       showed no anal or rectal abnormalities. Impression:            - Three 4 to 5 mm polyps in the descending colon,                         removed with a cold snare. Complete resection. Partial                         retrieval.                        - The examination was otherwise normal.                        - The distal rectum and anal verge are normal on                         retroflexion view. Recommendation:        - Discharge patient to home (with escort).                        - Resume previous diet.                        - Continue present medications.                        - Await pathology results.                        - Repeat colonoscopy for surveillance based on                         pathology results.                        - Return to GI office PRN. Procedure Code(s):     --- Professional ---                        [phone],  Colonoscopy, flexible; with removal of                         tumor(s), polyp(s), or other lesion(s) by snare                         technique Diagnosis Code(s):     --- Professional ---                        Z12.11, Encounter for screening for malignant neoplasm                         of colon                        K63.5, Polyp of colon CPT copyright 2019 American Medical Association. All rights reserved. The codes documented in this report are preliminary and upon coder review may  be revised to meet current compliance requirements. Wyline Mood, MD Wyline Mood  MD, MD 01/15/2019 10:11:20 AM This report has been signed electronically. Number of Addenda: 0 Note Initiated On: 01/15/2019 9:47 AM Scope Withdrawal Time: 0 hours 10 minutes 12 seconds  Total Procedure Duration: 0 hours 15 minutes 33 seconds  Estimated Blood Loss:  Estimated blood loss: none.      [facility]

## 2019-01-15 NOTE — Transfer of Care (Signed)
Immediate Anesthesia Transfer of Care Note  Patient: Margaret Lynch  Procedure(s) Performed: COLONOSCOPY WITH PROPOFOL (N/A )  Patient Location: Endoscopy Unit  Anesthesia Type:General  Level of Consciousness: awake and alert   Airway & Oxygen Therapy: Patient Spontanous Breathing  Post-op Assessment: Report given to RN and Post -op Vital signs reviewed and stable  Post vital signs: Reviewed  Last Vitals:  Vitals Value Taken Time  BP 112/72 01/15/19 1013  Temp 36.1 C 01/15/19 1013  Pulse 78 01/15/19 1014  Resp 17 01/15/19 1014  SpO2 96 % 01/15/19 1014  Vitals shown include unvalidated device data.  Last Pain:  Vitals:   01/15/19 1012  TempSrc: Temporal  PainSc:          Complications: No apparent anesthesia complications

## 2019-01-15 NOTE — Anesthesia Post-op Follow-up Note (Signed)
Anesthesia QCDR form completed.        

## 2019-01-15 NOTE — Anesthesia Postprocedure Evaluation (Signed)
Anesthesia Post Note  Patient: Margaret Lynch  Procedure(s) Performed: COLONOSCOPY WITH PROPOFOL (N/A )  Patient location during evaluation: PACU Anesthesia Type: General Level of consciousness: awake and alert Pain management: pain level controlled Vital Signs Assessment: post-procedure vital signs reviewed and stable Respiratory status: spontaneous breathing, nonlabored ventilation and respiratory function stable Cardiovascular status: blood pressure returned to baseline and stable Postop Assessment: no apparent nausea or vomiting Anesthetic complications: no     Last Vitals:  Vitals:   01/15/19 1032 01/15/19 1042  BP: 128/80 134/81  Pulse:    Resp:    Temp:    SpO2:      Last Pain:  Vitals:   01/15/19 1042  TempSrc:   PainSc: 0-No pain                 Durenda Hurt

## 2019-01-16 ENCOUNTER — Encounter: Payer: Self-pay | Admitting: *Deleted

## 2019-01-16 LAB — SURGICAL PATHOLOGY

## 2019-01-29 ENCOUNTER — Telehealth: Payer: Self-pay

## 2019-01-29 NOTE — Telephone Encounter (Signed)
Called pt several times to inform her of her labs, but pt did not answer phone. Mailed results to patient.

## 2019-01-29 NOTE — Telephone Encounter (Signed)
-----   Message from Ronnell Freshwater, NP sent at 01/10/2019 11:08 AM EST ----- Please let the patient know that her labs show Mild vitamin d deficiency and Slight elevation of creatinine. Advise she increase water intake and take OTC vitamin D3 everyday. Other labs look good. Thanks

## 2019-02-04 ENCOUNTER — Encounter: Payer: Self-pay | Admitting: Gastroenterology

## 2019-02-27 ENCOUNTER — Ambulatory Visit
Admission: RE | Admit: 2019-02-27 | Discharge: 2019-02-27 | Disposition: A | Discharge status: Home / Self Care | Payer: Managed Care, Other (non HMO) | Source: Ambulatory Visit | Attending: Nurse Practitioner | Admitting: Nurse Practitioner

## 2019-02-27 DIAGNOSIS — Z1231 Encounter for screening mammogram for malignant neoplasm of breast: Secondary | ICD-10-CM | POA: Diagnosis present

## 2019-03-07 NOTE — Progress Notes (Signed)
Negative mammogram

## 2019-05-02 ENCOUNTER — Other Ambulatory Visit: Payer: Self-pay

## 2019-05-02 DIAGNOSIS — I10 Essential (primary) hypertension: Secondary | ICD-10-CM

## 2019-05-02 MED ORDER — LOSARTAN POTASSIUM 100 MG PO TABS: 100.0000 mg | ORAL_TABLET | Freq: Every day | ORAL | 1 refills | Status: DC

## 2019-06-14 ENCOUNTER — Telehealth: Payer: Self-pay

## 2019-06-14 NOTE — Telephone Encounter (Signed)
Lmom to confirm and screen for 06-19-19 ov. 

## 2019-06-18 ENCOUNTER — Ambulatory Visit: Payer: Managed Care, Other (non HMO) | Admitting: Nurse Practitioner

## 2019-06-19 ENCOUNTER — Ambulatory Visit: Payer: Managed Care, Other (non HMO) | Admitting: Nurse Practitioner

## 2019-06-19 ENCOUNTER — Encounter: Payer: Self-pay | Admitting: Nurse Practitioner

## 2019-06-19 ENCOUNTER — Other Ambulatory Visit: Payer: Self-pay

## 2019-06-19 VITALS — BP 138/86 | HR 74 | Temp 97.8°F | Resp 16 | Ht 64.0 in | Wt 200.6 lb

## 2019-06-19 DIAGNOSIS — B373 Candidiasis of vulva and vagina: Secondary | ICD-10-CM | POA: Diagnosis not present

## 2019-06-19 DIAGNOSIS — I10 Essential (primary) hypertension: Secondary | ICD-10-CM

## 2019-06-19 DIAGNOSIS — B372 Candidiasis of skin and nail: Secondary | ICD-10-CM

## 2019-06-19 DIAGNOSIS — K219 Gastro-esophageal reflux disease without esophagitis: Secondary | ICD-10-CM | POA: Diagnosis not present

## 2019-06-19 DIAGNOSIS — B3731 Acute candidiasis of vulva and vagina: Secondary | ICD-10-CM

## 2019-06-19 MED ORDER — ATENOLOL 25 MG PO TABS: 25.0000 mg | ORAL_TABLET | Freq: Every day | ORAL | 1 refills | Status: DC

## 2019-06-19 MED ORDER — KETOCONAZOLE 2 % EX CREA: 1.0000 "application " | TOPICAL_CREAM | Freq: Every day | CUTANEOUS | 5 refills | Status: DC

## 2019-06-19 MED ORDER — FLUCONAZOLE 150 MG PO TABS: ORAL_TABLET | ORAL | 5 refills | Status: DC

## 2019-06-19 NOTE — Progress Notes (Signed)
[facility] [place], [place] [address]  Internal MEDICINE  Office Visit Note  Patient Name: Margaret Lynch  [id number]  [id number]  Date of Service: 06/19/2019  Chief Complaint  Patient presents with  . Hypertension  . Gastroesophageal Reflux    The patient is here for routine follow up. Blood pressure is a bit elevated at the start of visit. She states that it is generally high when she first arrives. She feels well. She does still have some vaginal irritation from persistent fungal infection. Did take diflucan weekly for 6 months while using ketoconazole cream daily. She finished treatment, but as she is off this longer, the rash seems to be recurring. She states she will refill prescription for medication we used to treat this condition which does help. She had routine labs done in 01/2019, showing mild vitamin d deficiency. She has had her colonoscopy done 120/2020. She had three small polyps removed. She should repeat In 5 years. Mammogram was done 02/28/2019 and was negative. When doing her pap in 12/2018, specimen was unsatisfactory for evaluation as it lacked cellularity. The HPV was negative. Will repeat her pap smear at her next physical.  She does plan to get vaccinated for COVID 19.       Current Medication: Outpatient Encounter Medications as of 06/19/2019  Medication Sig  . atenolol (TENORMIN) 25 MG tablet Take 1 tablet (25 mg total) by mouth at bedtime.  . fluticasone (FLONASE) 50 MCG/ACT nasal spray Place 2 sprays into both nostrils daily.  Marland Kitchen losartan (COZAAR) 100 MG tablet Take 1 tablet (100 mg total) by mouth daily.  Marland Kitchen omeprazole (PRILOSEC) 40 MG capsule Take 1 capsule (40 mg total) by mouth daily.  . fluconazole (DIFLUCAN) 150 MG tablet Take 1 tablet po once weekly for persistent yeast infection.  Marland Kitchen ketoconazole (NIZORAL) 2 % cream Apply 1 application topically daily.  . [DISCONTINUED] fluconazole (DIFLUCAN) 150 MG tablet Take 1 tablet po  once weekly for persistent yeast infection. (Patient not taking: Reported on 06/19/2019)  . [DISCONTINUED] ketoconazole (NIZORAL) 2 % cream Apply 1 application topically daily. (Patient not taking: Reported on 06/19/2019)  . [DISCONTINUED] lidocaine (XYLOCAINE) 2 % solution Apply very small amount to affect area three to four times daily as needed (Patient not taking: Reported on 06/19/2019)  . [DISCONTINUED] nystatin (MYCOSTATIN/NYSTOP) powder Apply topically 3 (three) times daily. (Patient not taking: Reported on 06/19/2019)  . [DISCONTINUED] sulfamethoxazole-trimethoprim (BACTRIM DS) 800-160 MG tablet Take 1 tablet by mouth 2 (two) times daily. (Patient not taking: Reported on 06/19/2019)  . [DISCONTINUED] traZODone (DESYREL) 50 MG tablet Take 0.5-1 tablets (25-50 mg total) by mouth at bedtime as needed for sleep. (Patient not taking: Reported on 06/19/2019)   No facility-administered encounter medications on file as of 06/19/2019.    Surgical History: Past Surgical History:  Procedure Laterality Date  . COLONOSCOPY WITH PROPOFOL    . COLONOSCOPY WITH PROPOFOL N/A 01/15/2019   Procedure: COLONOSCOPY WITH PROPOFOL;  Surgeon: Jonathon Bellows, MD;  Location: [facility] ENDOSCOPY;  Service: Gastroenterology;  Laterality: N/A;    Medical History: Past Medical History:  Diagnosis Date  . Acid reflux   . HTN (hypertension)   . Nocturia   . Overweight   . Urinary frequency     Family History: Family History  Problem Relation Age of Onset  . Hypertension Mother   . Diabetes Mother   . Esophageal cancer Father   . Kidney disease Neg Hx   . Prostate cancer Neg Hx   .  Bladder Cancer Neg Hx   . Breast cancer Neg Hx     Social History   Socioeconomic History  . Marital status: Single    Spouse name: Not on file  . Number of children: Not on file  . Years of education: Not on file  . Highest education level: Not on file  Occupational History  . Not on file  Tobacco Use  . Smoking status: Never  Smoker  . Smokeless tobacco: Never Used  Substance and Sexual Activity  . Alcohol use: No    Alcohol/week: 0.0 standard drinks  . Drug use: No  . Sexual activity: Not on file  Other Topics Concern  . Not on file  Social History Narrative  . Not on file   Social Determinants of Health   Financial Resource Strain:   . Difficulty of Paying Living Expenses:   Food Insecurity:   . Worried About Programme researcher, broadcasting/film/video in the Last Year:   . Barista in the Last Year:   Transportation Needs:   . Freight forwarder (Medical):   Marland Kitchen Lack of Transportation (Non-Medical):   Physical Activity:   . Days of Exercise per Week:   . Minutes of Exercise per Session:   Stress:   . Feeling of Stress :   Social Connections:   . Frequency of Communication with Friends and Family:   . Frequency of Social Gatherings with Friends and Family:   . Attends Religious Services:   . Active Member of Clubs or Organizations:   . Attends Banker Meetings:   Marland Kitchen Marital Status:   Intimate Partner Violence:   . Fear of Current or Ex-Partner:   . Emotionally Abused:   Marland Kitchen Physically Abused:   . Sexually Abused:       Review of Systems  Constitutional: Negative for activity change, chills, fatigue and unexpected weight change.  HENT: Negative for congestion, postnasal drip, rhinorrhea, sneezing and sore throat.   Respiratory: Negative for cough, chest tightness, shortness of breath and wheezing.   Cardiovascular: Negative for chest pain and palpitations.       Blood pressure elevated upon arrival.  Gastrointestinal: Negative for abdominal pain, constipation, diarrhea, nausea and vomiting.  Endocrine: Negative for cold intolerance, heat intolerance, polydipsia and polyuria.  Genitourinary: Positive for vaginal discharge. Negative for dysuria, flank pain, frequency, hematuria and pelvic pain.       Improved rash along the labia. Completed long term treatmen with diflucan and ketoconazole  cream. Now starting to become more severe.   Musculoskeletal: Negative for arthralgias, back pain, joint swelling and neck pain.  Skin: Negative for rash.  Allergic/Immunologic: Negative for environmental allergies.  Neurological: Negative for dizziness, tremors, numbness and headaches.  Hematological: Negative for adenopathy. Does not bruise/bleed easily.  Psychiatric/Behavioral: Negative for behavioral problems (Depression), dysphoric mood, sleep disturbance and suicidal ideas. The patient is not nervous/anxious.     Today's Vitals   06/19/19 1354  BP: 138/86  Pulse: 74  Resp: 16  Temp: 97.8 F (36.6 C)  SpO2: 96%  Weight: 200 lb 9.6 oz (91 kg)  Height: 5\' 4"  (1.626 m)   Body mass index is 34.43 kg/m.  Physical Exam Vitals and nursing note reviewed.  Constitutional:      General: She is not in acute distress.    Appearance: Normal appearance. She is well-developed. She is obese. She is not diaphoretic.  HENT:     Head: Normocephalic and atraumatic.     Mouth/Throat:  Pharynx: No oropharyngeal exudate.  Eyes:     Pupils: Pupils are equal, round, and reactive to light.  Neck:     Thyroid: No thyromegaly.     Vascular: No JVD.     Trachea: No tracheal deviation.  Cardiovascular:     Rate and Rhythm: Normal rate and regular rhythm.     Heart sounds: Normal heart sounds. No murmur. No friction rub. No gallop.   Pulmonary:     Effort: Pulmonary effort is normal. No respiratory distress.     Breath sounds: Normal breath sounds. No wheezing or rales.  Chest:     Chest wall: No tenderness.  Abdominal:     Palpations: Abdomen is soft.  Musculoskeletal:        General: Normal range of motion.     Cervical back: Normal range of motion and neck supple.  Lymphadenopathy:     Cervical: No cervical adenopathy.  Skin:    General: Skin is warm and dry.  Neurological:     Mental Status: She is alert and oriented to person, place, and time.     Cranial Nerves: No cranial  nerve deficit.  Psychiatric:        Behavior: Behavior normal.        Thought Content: Thought content normal.        Judgment: Judgment normal.    Assessment/Plan: 1. Benign essential HTN Stable. Continue bp medication as prescribed   2. Cutaneous candidiasis Use ketoconazole cream daily as needed  - ketoconazole (NIZORAL) 2 % cream; Apply 1 application topically daily.  Dispense: 30 g; Refill: 5  3. Vaginal candidiasis Restart use of diflucan 150mg  weekly.  - fluconazole (DIFLUCAN) 150 MG tablet; Take 1 tablet po once weekly for persistent yeast infection.  Dispense: 5 tablet; Refill: 5  4. Gastroesophageal reflux disease without esophagitis Use OTC medication to reduce GERD symptoms when needed.   General Counseling: [place] surges understanding of the findings of todays visit and agrees with plan of treatment. I have discussed any further diagnostic evaluation that may be needed or ordered today. We also reviewed her medications today. she has been encouraged to call the office with any questions or concerns that should arise related to todays visit.  Hypertension Counseling:   The following hypertensive lifestyle modification were recommended and discussed:  1. Limiting alcohol intake to less than 1 oz/day of ethanol:(24 oz of beer or 8 oz of wine or 2 oz of 100-proof whiskey). 2. Take baby ASA 81 mg daily. 3. Importance of regular aerobic exercise and losing weight. 4. Reduce dietary saturated fat and cholesterol intake for overall cardiovascular health. 5. Maintaining adequate dietary potassium, calcium, and magnesium intake. 6. Regular monitoring of the blood pressure. 7. Reduce sodium intake to less than 100 mmol/day (less than 2.3 gm of sodium or less than 6 gm of sodium choride)   This patient was seen by [name] FNP Collaboration with Dr Vincent Gros as a part of collaborative care agreement  Meds ordered this encounter  Medications  . ketoconazole (NIZORAL)  2 % cream    Sig: Apply 1 application topically daily.    Dispense:  30 g    Refill:  5    Order Specific Question:   Supervising Provider    Answer:   Lyndon Code [[id number]]  . fluconazole (DIFLUCAN) 150 MG tablet    Sig: Take 1 tablet po once weekly for persistent yeast infection.    Dispense:  5 tablet  Refill:  5    Order Specific Question:   Supervising Provider    Answer:   Lavera Guise [[id number]]    Total time spent: 30 Minutes   Time spent includes review of chart, medications, test results, and follow up plan with the patient.      Dr Lavera Guise Internal medicine

## 2019-08-18 ENCOUNTER — Ambulatory Visit: Payer: Managed Care, Other (non HMO) | Attending: Internal Medicine

## 2019-08-18 DIAGNOSIS — Z23 Encounter for immunization: Secondary | ICD-10-CM

## 2019-08-18 NOTE — Progress Notes (Signed)
   Covid-19 Vaccination Clinic  Name:  ANNAMARIE YAMAGUCHI    MRN: [id number] DOB: April 01, 1965  08/18/2019  Ms. Kimberlin was observed post Covid-19 immunization for 15 minutes without incident. She was provided with Vaccine Information Sheet and instruction to access the V-Safe system.   Ms. Riddell was instructed to call 911 with any severe reactions post vaccine: Marland Kitchen Difficulty breathing  . Swelling of face and throat  . A fast heartbeat  . A bad rash all over body  . Dizziness and weakness   Immunizations Administered    Name Date Dose VIS Date Route   Pfizer COVID-19 Vaccine 08/18/2019  9:49 AM 0.3 mL 03/28/2018 Intramuscular   Manufacturer: [employer], [employer]   Lot: [id number]   NDC: [address]-0

## 2019-08-30 ENCOUNTER — Other Ambulatory Visit: Payer: Self-pay

## 2019-08-30 DIAGNOSIS — K219 Gastro-esophageal reflux disease without esophagitis: Secondary | ICD-10-CM

## 2019-08-30 MED ORDER — OMEPRAZOLE 40 MG PO CPDR: 40.0000 mg | DELAYED_RELEASE_CAPSULE | Freq: Every day | ORAL | 5 refills | Status: DC

## 2019-09-10 ENCOUNTER — Ambulatory Visit: Payer: Managed Care, Other (non HMO)

## 2019-10-11 ENCOUNTER — Other Ambulatory Visit: Payer: Self-pay

## 2019-10-11 DIAGNOSIS — I10 Essential (primary) hypertension: Secondary | ICD-10-CM

## 2019-10-11 MED ORDER — LOSARTAN POTASSIUM 100 MG PO TABS: 100.0000 mg | ORAL_TABLET | Freq: Every day | ORAL | 1 refills | Status: DC

## 2019-10-19 ENCOUNTER — Encounter: Payer: Self-pay | Admitting: Nurse Practitioner

## 2019-10-19 NOTE — Telephone Encounter (Signed)
Can I see her for this please

## 2019-10-22 ENCOUNTER — Telehealth: Payer: Self-pay

## 2019-10-22 NOTE — Telephone Encounter (Signed)
I left a message and asked pt to call and schedule appointment for appeal form, this can be scheduled with any pcp provider. Beth

## 2019-10-31 ENCOUNTER — Ambulatory Visit: Payer: Managed Care, Other (non HMO) | Admitting: Internal Medicine

## 2019-10-31 ENCOUNTER — Other Ambulatory Visit: Payer: Self-pay

## 2019-10-31 ENCOUNTER — Encounter: Payer: Self-pay | Admitting: Internal Medicine

## 2019-10-31 VITALS — BP 128/84 | HR 57 | Temp 97.6°F | Resp 16 | Ht 64.0 in | Wt 201.4 lb

## 2019-10-31 DIAGNOSIS — I10 Essential (primary) hypertension: Secondary | ICD-10-CM | POA: Diagnosis not present

## 2019-10-31 DIAGNOSIS — R7301 Impaired fasting glucose: Secondary | ICD-10-CM

## 2019-10-31 DIAGNOSIS — Z6834 Body mass index (BMI) 34.0-34.9, adult: Secondary | ICD-10-CM | POA: Diagnosis not present

## 2019-10-31 DIAGNOSIS — R001 Bradycardia, unspecified: Secondary | ICD-10-CM | POA: Diagnosis not present

## 2019-10-31 LAB — POCT GLYCOSYLATED HEMOGLOBIN (HGB A1C): Hemoglobin A1C: 5.4 % (ref 4.0–5.6)

## 2019-10-31 NOTE — Progress Notes (Signed)
[facility] [address] [place], [place] [address]  Internal MEDICINE  Office Visit Note  Patient Name: Margaret Lynch  [id number]  [id number]  Date of Service: 11/03/2019  Chief Complaint  Patient presents with  . Follow-up    appeal form  . Hypertension  . Quality Metric Gaps    HepC, TDAP     HPI Pt is here to get some paper work done from [facility]. Her heath insurance has certain wellness guidelines to meet and she was unable to meet her BMI. She is willing to make some changes in her diet. She is thinking of cooking at home as they eat out most of the time. She might be a good candidate for Leptin due to mild abnormal glucose. She also has HTN, bradycardia is noticed, she is on Tenormin and cozaar    Current Medication: Outpatient Encounter Medications as of 10/31/2019  Medication Sig  . atenolol (TENORMIN) 25 MG tablet Take 1 tablet (25 mg total) by mouth at bedtime.  . fluticasone (FLONASE) 50 MCG/ACT nasal spray Place 2 sprays into both nostrils daily.  Marland Kitchen losartan (COZAAR) 100 MG tablet Take 1 tablet (100 mg total) by mouth daily.  Marland Kitchen omeprazole (PRILOSEC) 40 MG capsule Take 1 capsule (40 mg total) by mouth daily.  . fluconazole (DIFLUCAN) 150 MG tablet Take 1 tablet po once weekly for persistent yeast infection. (Patient not taking: Reported on 10/31/2019)  . ketoconazole (NIZORAL) 2 % cream Apply 1 application topically daily. (Patient not taking: Reported on 10/31/2019)   No facility-administered encounter medications on file as of 10/31/2019.    Surgical History: Past Surgical History:  Procedure Laterality Date  . COLONOSCOPY WITH PROPOFOL    . COLONOSCOPY WITH PROPOFOL N/A 01/15/2019   Procedure: COLONOSCOPY WITH PROPOFOL;  Surgeon: Wyline Mood, MD;  Location: [facility] ENDOSCOPY;  Service: Gastroenterology;  Laterality: N/A;    Medical History: Past Medical History:  Diagnosis Date  . Acid reflux   . HTN (hypertension)   . Nocturia   . Overweight    . Urinary frequency     Family History: Family History  Problem Relation Age of Onset  . Hypertension Mother   . Diabetes Mother   . Esophageal cancer Father   . Kidney disease Neg Hx   . Prostate cancer Neg Hx   . Bladder Cancer Neg Hx   . Breast cancer Neg Hx     Social History   Socioeconomic History  . Marital status: Single    Spouse name: Not on file  . Number of children: Not on file  . Years of education: Not on file  . Highest education level: Not on file  Occupational History  . Not on file  Tobacco Use  . Smoking status: Never Smoker  . Smokeless tobacco: Never Used  Vaping Use  . Vaping Use: Never used  Substance and Sexual Activity  . Alcohol use: No    Alcohol/week: 0.0 standard drinks  . Drug use: No  . Sexual activity: Not on file  Other Topics Concern  . Not on file  Social History Narrative  . Not on file   Social Determinants of Health   Financial Resource Strain:   . Difficulty of Paying Living Expenses: Not on file  Food Insecurity:   . Worried About Programme researcher, broadcasting/film/video in the Last Year: Not on file  . Ran Out of Food in the Last Year: Not on file  Transportation Needs:   . Lack of  Transportation (Medical): Not on file  . Lack of Transportation (Non-Medical): Not on file  Physical Activity:   . Days of Exercise per Week: Not on file  . Minutes of Exercise per Session: Not on file  Stress:   . Feeling of Stress : Not on file  Social Connections:   . Frequency of Communication with Friends and Family: Not on file  . Frequency of Social Gatherings with Friends and Family: Not on file  . Attends Religious Services: Not on file  . Active Member of Clubs or Organizations: Not on file  . Attends Banker Meetings: Not on file  . Marital Status: Not on file  Intimate Partner Violence:   . Fear of Current or Ex-Partner: Not on file  . Emotionally Abused: Not on file  . Physically Abused: Not on file  . Sexually Abused: Not on  file      Review of Systems  Constitutional: Positive for fatigue. Negative for chills and diaphoresis.  HENT: Negative for ear pain, postnasal drip and sinus pressure.   Eyes: Negative for photophobia, discharge, redness, itching and visual disturbance.  Respiratory: Negative for cough, shortness of breath and wheezing.   Cardiovascular: Negative for chest pain, palpitations and leg swelling.  Gastrointestinal: Negative for abdominal pain, constipation, diarrhea, nausea and vomiting.  Genitourinary: Negative for dysuria and flank pain.  Musculoskeletal: Negative for arthralgias, back pain, gait problem and neck pain.  Skin: Negative for color change.  Allergic/Immunologic: Negative for environmental allergies and food allergies.  Neurological: Negative for dizziness and headaches.  Hematological: Does not bruise/bleed easily.  Psychiatric/Behavioral: Negative for agitation, behavioral problems (depression) and hallucinations.    Vital Signs: BP 128/84   Pulse (!) 57   Temp 97.6 F (36.4 C)   Resp 16   Ht 5\' 4"  (1.626 m)   Wt 201 lb 6.4 oz (91.4 kg)   SpO2 97%   BMI 34.57 kg/m    Physical Exam Constitutional:      Appearance: She is obese.  HENT:     Head: Normocephalic and atraumatic.  Eyes:     Extraocular Movements: Extraocular movements intact.  Cardiovascular:     Rate and Rhythm: Normal rate and regular rhythm.     Pulses: Normal pulses.     Heart sounds: Normal heart sounds. No murmur heard.   Pulmonary:     Effort: Pulmonary effort is normal.     Breath sounds: Normal breath sounds. No rhonchi.  Skin:    General: Skin is warm and dry.  Neurological:     General: No focal deficit present.     Mental Status: She is oriented to person, place, and time.  Psychiatric:        Mood and Affect: Mood normal.        Behavior: Behavior normal.    Assessment/Plan: 1. Benign essential HTN Pt has apple watch and will monitor her heart rate, will change tenormin  on next visit   2. BMI 34.0-34.9,adult Pt will choose healthy meal option and will start cooking at home. Paper work is filed out   3. Bradycardia, drug induced Most likely due to b blocker, will monitor   4. Impaired fasting glucose - POCT HgB A1C, borderline, might be a good candidate for GLIPs  General Counseling: 02-20-1976 understanding of the findings of todays visit and agrees with plan of treatment. I have discussed any further diagnostic evaluation that may be needed or ordered today. We also reviewed her medications today.  she has been encouraged to call the office with any questions or concerns that should arise related to todays visit. Orders Placed This Encounter  Procedures  . POCT HgB A1C    Total time spent 35 Minutes Time spent includes review of chart, medications, test results, and follow up plan with the patient.      Dr Lyndon Code Internal medicine

## 2019-12-06 ENCOUNTER — Other Ambulatory Visit: Payer: Self-pay

## 2019-12-06 DIAGNOSIS — I10 Essential (primary) hypertension: Secondary | ICD-10-CM

## 2019-12-06 MED ORDER — ATENOLOL 25 MG PO TABS: 25.0000 mg | ORAL_TABLET | Freq: Every day | ORAL | 1 refills | Status: DC

## 2019-12-18 ENCOUNTER — Encounter: Payer: Self-pay | Admitting: Nurse Practitioner

## 2019-12-18 ENCOUNTER — Other Ambulatory Visit: Payer: Self-pay

## 2019-12-18 ENCOUNTER — Ambulatory Visit (INDEPENDENT_AMBULATORY_CARE_PROVIDER_SITE_OTHER): Payer: Managed Care, Other (non HMO) | Admitting: Nurse Practitioner

## 2019-12-18 VITALS — BP 142/80 | HR 85 | Temp 97.7°F | Resp 16 | Ht 64.0 in | Wt 204.0 lb

## 2019-12-18 DIAGNOSIS — R300 Dysuria: Secondary | ICD-10-CM

## 2019-12-18 DIAGNOSIS — Z1231 Encounter for screening mammogram for malignant neoplasm of breast: Secondary | ICD-10-CM | POA: Diagnosis not present

## 2019-12-18 DIAGNOSIS — Z0001 Encounter for general adult medical examination with abnormal findings: Secondary | ICD-10-CM | POA: Diagnosis not present

## 2019-12-18 DIAGNOSIS — I10 Essential (primary) hypertension: Secondary | ICD-10-CM

## 2019-12-18 DIAGNOSIS — Z23 Encounter for immunization: Secondary | ICD-10-CM

## 2019-12-18 NOTE — Progress Notes (Signed)
[facility] [address] [place], [place] [address]  Internal MEDICINE  Office Visit Note  Patient Name: Margaret Lynch  [id number]  [id number]  Date of Service: 01/13/2020   Pt is here for routine health maintenance examination  Chief Complaint  Patient presents with  . Annual Exam  . Gastroesophageal Reflux  . Hypertension  . Quality Metric Gaps    flu,Hep C  . controlled substance form    reviewed with PT     The patient presents for health maintenance exam. Her blood pressure is well managed.  She is due to have routine, fasting labs. She will be due for screening mammogram in 02/2020. She would like to get her flu shot while she is in the office today. She has no new concerns or complaints today.     Current Medication: Outpatient Encounter Medications as of 12/18/2019  Medication Sig  . atenolol (TENORMIN) 25 MG tablet Take 1 tablet (25 mg total) by mouth at bedtime.  . fluticasone (FLONASE) 50 MCG/ACT nasal spray Place 2 sprays into both nostrils daily.  Marland Kitchen ketoconazole (NIZORAL) 2 % cream Apply 1 application topically daily.  Marland Kitchen losartan (COZAAR) 100 MG tablet Take 1 tablet (100 mg total) by mouth daily.  Marland Kitchen omeprazole (PRILOSEC) 40 MG capsule Take 1 capsule (40 mg total) by mouth daily.  . [DISCONTINUED] fluconazole (DIFLUCAN) 150 MG tablet Take 1 tablet po once weekly for persistent yeast infection. (Patient not taking: Reported on 10/31/2019)   No facility-administered encounter medications on file as of 12/18/2019.    Surgical History: Past Surgical History:  Procedure Laterality Date  . COLONOSCOPY WITH PROPOFOL    . COLONOSCOPY WITH PROPOFOL N/A 01/15/2019   Procedure: COLONOSCOPY WITH PROPOFOL;  Surgeon: Wyline Mood, MD;  Location: [facility] ENDOSCOPY;  Service: Gastroenterology;  Laterality: N/A;    Medical History: Past Medical History:  Diagnosis Date  . Acid reflux   . HTN (hypertension)   . Nocturia   . Overweight   . Urinary  frequency     Family History: Family History  Problem Relation Age of Onset  . Hypertension Mother   . Diabetes Mother   . Esophageal cancer Father   . Kidney disease Neg Hx   . Prostate cancer Neg Hx   . Bladder Cancer Neg Hx   . Breast cancer Neg Hx       Review of Systems  Constitutional: Negative for activity change, chills, fatigue and unexpected weight change.  HENT: Negative for congestion, postnasal drip, rhinorrhea, sneezing and sore throat.   Respiratory: Negative for cough, chest tightness, shortness of breath and wheezing.   Cardiovascular: Negative for chest pain and palpitations.  Gastrointestinal: Negative for abdominal pain, constipation, diarrhea, nausea and vomiting.  Endocrine: Negative for cold intolerance, heat intolerance, polydipsia and polyuria.  Genitourinary: Negative for dysuria, frequency and urgency.  Musculoskeletal: Negative for arthralgias, back pain, joint swelling and neck pain.  Skin: Negative for rash.  Allergic/Immunologic: Negative for environmental allergies.  Neurological: Negative for dizziness, tremors, numbness and headaches.  Hematological: Negative for adenopathy. Does not bruise/bleed easily.  Psychiatric/Behavioral: Negative for behavioral problems (Depression), sleep disturbance and suicidal ideas. The patient is not nervous/anxious.      Today's Vitals   12/18/19 1503  BP: (!) 142/80  Pulse: 85  Resp: 16  Temp: 97.7 F (36.5 C)  SpO2: 98%  Weight: 204 lb (92.5 kg)  Height: 5\' 4"  (1.626 m)   Body mass index is 35.02 kg/m.  Physical Exam Vitals  and nursing note reviewed.  Constitutional:      General: She is not in acute distress.    Appearance: Normal appearance. She is well-developed and well-nourished. She is obese. She is not diaphoretic.  HENT:     Head: Normocephalic and atraumatic.     Nose: Nose normal.     Mouth/Throat:     Mouth: Oropharynx is clear and moist.     Pharynx: No oropharyngeal exudate.   Eyes:     Extraocular Movements: EOM normal.     Pupils: Pupils are equal, round, and reactive to light.  Neck:     Thyroid: No thyromegaly.     Vascular: No carotid bruit or JVD.     Trachea: No tracheal deviation.  Cardiovascular:     Rate and Rhythm: Normal rate and regular rhythm.     Pulses: Normal pulses.     Heart sounds: Normal heart sounds. No murmur heard. No friction rub. No gallop.   Pulmonary:     Effort: Pulmonary effort is normal. No respiratory distress.     Breath sounds: Normal breath sounds. No wheezing or rales.  Chest:     Chest wall: No tenderness.  Breasts:     Right: Normal. No swelling, bleeding, inverted nipple, mass, nipple discharge, skin change, tenderness or axillary adenopathy.     Left: Normal. No swelling, bleeding, inverted nipple, mass, nipple discharge, skin change, tenderness or axillary adenopathy.    Abdominal:     General: Bowel sounds are normal.     Palpations: Abdomen is soft.     Tenderness: There is no abdominal tenderness.  Musculoskeletal:        General: Normal range of motion.     Cervical back: Normal range of motion and neck supple.  Lymphadenopathy:     Cervical: No cervical adenopathy.     Upper Body:     Right upper body: No axillary adenopathy.     Left upper body: No axillary adenopathy.  Skin:    General: Skin is warm and dry.     Capillary Refill: Capillary refill takes less than 2 seconds.  Neurological:     General: No focal deficit present.     Mental Status: She is alert and oriented to person, place, and time.     Cranial Nerves: No cranial nerve deficit.  Psychiatric:        Mood and Affect: Mood and affect and mood normal.        Behavior: Behavior normal.        Thought Content: Thought content normal.        Judgment: Judgment normal.      LABS: Recent Results (from the past 2160 hour(s))  POCT HgB A1C     Status: None   Collection Time: 10/31/19  9:50 AM  Result Value Ref Range   Hemoglobin  A1C 5.4 4.0 - 5.6 %   HbA1c POC (<> result, manual entry)     HbA1c, POC (prediabetic range)     HbA1c, POC (controlled diabetic range)    UA/M w/rflx Culture, Routine     Status: Abnormal   Collection Time: 12/18/19  4:31 PM   Specimen: Urine   Urine  Result Value Ref Range   Specific Gravity, UA 1.021 1.005 - 1.030   pH, UA 5.5 5.0 - 7.5   Color, UA Yellow Yellow   Appearance Ur Clear Clear   Leukocytes,UA 2+ (A) Negative   Protein,UA Negative Negative/Trace   Glucose, UA Negative Negative  Ketones, UA Negative Negative   RBC, UA Negative Negative   Bilirubin, UA Negative Negative   Urobilinogen, Ur 0.2 0.2 - 1.0 mg/dL   Nitrite, UA Negative Negative   Microscopic Examination See below:     Comment: Microscopic was indicated and was performed.   Urinalysis Reflex Comment     Comment: This specimen has reflexed to a Urine Culture.  Microscopic Examination     Status: Abnormal   Collection Time: 12/18/19  4:31 PM   Urine  Result Value Ref Range   WBC, UA 11-30 (A) 0 - 5 /hpf   RBC None seen 0 - 2 /hpf   Epithelial Cells (non renal) 0-10 0 - 10 /hpf   Casts None seen None seen /lpf   Bacteria, UA None seen None seen/Few  Urine Culture, Reflex     Status: Abnormal   Collection Time: 12/18/19  4:31 PM   Urine  Result Value Ref Range   Urine Culture, Routine CANCELED (A)     Comment: Test not performed. Specimen not received at refrigerated temperature.  Result canceled by the ancillary.     Assessment/Plan: 1. Encounter for general adult medical examination with abnormal findings Annual health maintenance exam today. Order slip given to have routine, fasting labs drawn.   2. Benign essential HTN Stable. Continue bp medication as prescribed   3. Encounter for screening mammogram for malignant neoplasm of breast - MM DIGITAL SCREENING BILATERAL; Future  4. Needs flu shot Flu vaccine administered today.  - Flu Vaccine MDCK QUAD PF  5. Dysuria - UA/M w/rflx  Culture, Routine  General Counseling: [name] understanding of the findings of todays visit and agrees with plan of treatment. I have discussed any further diagnostic evaluation that may be needed or ordered today. We also reviewed her medications today. she has been encouraged to call the office with any questions or concerns that should arise related to todays visit.    Counseling:  Hypertension Counseling:   The following hypertensive lifestyle modification were recommended and discussed:  1. Limiting alcohol intake to less than 1 oz/day of ethanol:(24 oz of beer or 8 oz of wine or 2 oz of 100-proof whiskey). 2. Take baby ASA 81 mg daily. 3. Importance of regular aerobic exercise and losing weight. 4. Reduce dietary saturated fat and cholesterol intake for overall cardiovascular health. 5. Maintaining adequate dietary potassium, calcium, and magnesium intake. 6. Regular monitoring of the blood pressure. 7. Reduce sodium intake to less than 100 mmol/day (less than 2.3 gm of sodium or less than 6 gm of sodium choride)   This patient was seen by Vincent Gros FNP Collaboration with Dr Lyndon Code as a part of collaborative care agreement  Orders Placed This Encounter  Procedures  . Microscopic Examination  . Urine Culture, Reflex  . MM DIGITAL SCREENING BILATERAL  . Flu Vaccine MDCK QUAD PF  . UA/M w/rflx Culture, Routine      Total time spent: 30 Minutes  Time spent includes review of chart, medications, test results, and follow up plan with the patient.     Lyndon Code, MD  Internal Medicine

## 2019-12-20 LAB — UA/M W/RFLX CULTURE, ROUTINE
Bilirubin, UA: NEGATIVE
Glucose, UA: NEGATIVE
Ketones, UA: NEGATIVE
Nitrite, UA: NEGATIVE
Protein,UA: NEGATIVE
RBC, UA: NEGATIVE
Specific Gravity, UA: 1.021 (ref 1.005–1.030)
Urobilinogen, Ur: 0.2 mg/dL (ref 0.2–1.0)
pH, UA: 5.5 (ref 5.0–7.5)

## 2019-12-20 LAB — MICROSCOPIC EXAMINATION
Bacteria, UA: NONE SEEN
Casts: NONE SEEN /lpf
RBC, Urine: NONE SEEN /hpf (ref 0–2)

## 2019-12-20 LAB — URINE CULTURE, REFLEX

## 2020-01-13 DIAGNOSIS — Z23 Encounter for immunization: Secondary | ICD-10-CM | POA: Insufficient documentation

## 2020-02-14 ENCOUNTER — Other Ambulatory Visit: Payer: Self-pay | Admitting: Nurse Practitioner

## 2020-02-15 LAB — CBC
Hematocrit: 44 % (ref 34.0–46.6)
Hemoglobin: 13.8 g/dL (ref 11.1–15.9)
MCH: 28.3 pg (ref 26.6–33.0)
MCHC: 31.4 g/dL — ABNORMAL LOW (ref 31.5–35.7)
MCV: 90 fL (ref 79–97)
Platelets: 193 10*3/uL (ref 150–450)
RBC: 4.87 x10E6/uL (ref 3.77–5.28)
RDW: 12 % (ref 11.7–15.4)
WBC: 6.2 10*3/uL (ref 3.4–10.8)

## 2020-02-15 LAB — COMPREHENSIVE METABOLIC PANEL
ALT: 22 IU/L (ref 0–32)
AST: 18 IU/L (ref 0–40)
Albumin/Globulin Ratio: 1.8 (ref 1.2–2.2)
Albumin: 4.7 g/dL (ref 3.8–4.9)
Alkaline Phosphatase: 104 IU/L (ref 44–121)
BUN/Creatinine Ratio: 14 (ref 9–23)
BUN: 12 mg/dL (ref 6–24)
Bilirubin Total: 0.6 mg/dL (ref 0.0–1.2)
CO2: 21 mmol/L (ref 20–29)
Calcium: 9.7 mg/dL (ref 8.7–10.2)
Chloride: 103 mmol/L (ref 96–106)
Creatinine, Ser: 0.83 mg/dL (ref 0.57–1.00)
GFR calc Af Amer: 91 mL/min/{1.73_m2} (ref >59)
GFR calc non Af Amer: 79 mL/min/{1.73_m2} (ref >59)
Globulin, Total: 2.6 g/dL (ref 1.5–4.5)
Glucose: 92 mg/dL (ref 65–99)
Potassium: 4.6 mmol/L (ref 3.5–5.2)
Sodium: 138 mmol/L (ref 134–144)
Total Protein: 7.3 g/dL (ref 6.0–8.5)

## 2020-02-15 LAB — LIPID PANEL WITH LDL/HDL RATIO
Cholesterol, Total: 131 mg/dL (ref 100–199)
HDL: 48 mg/dL (ref >39)
LDL Chol Calc (NIH): 70 mg/dL (ref 0–99)
LDL/HDL Ratio: 1.5 ratio (ref 0.0–3.2)
Triglycerides: 58 mg/dL (ref 0–149)
VLDL Cholesterol Cal: 13 mg/dL (ref 5–40)

## 2020-02-15 LAB — T4, FREE: Free T4: 1.39 ng/dL (ref 0.82–1.77)

## 2020-02-15 LAB — TSH: TSH: 2.25 u[IU]/mL (ref 0.450–4.500)

## 2020-02-15 LAB — VITAMIN D 25 HYDROXY (VIT D DEFICIENCY, FRACTURES): Vit D, 25-Hydroxy: 26.7 ng/mL — ABNORMAL LOW (ref 30.0–100.0)

## 2020-02-28 ENCOUNTER — Other Ambulatory Visit: Payer: Self-pay

## 2020-02-28 ENCOUNTER — Ambulatory Visit
Admission: RE | Admit: 2020-02-28 | Discharge: 2020-02-28 | Disposition: A | Discharge status: Home / Self Care | Payer: Managed Care, Other (non HMO) | Source: Ambulatory Visit | Attending: Nurse Practitioner | Admitting: Nurse Practitioner

## 2020-02-28 DIAGNOSIS — Z1231 Encounter for screening mammogram for malignant neoplasm of breast: Secondary | ICD-10-CM | POA: Diagnosis present

## 2020-03-28 ENCOUNTER — Other Ambulatory Visit: Payer: Self-pay | Admitting: Nurse Practitioner

## 2020-03-28 DIAGNOSIS — I10 Essential (primary) hypertension: Secondary | ICD-10-CM

## 2020-04-30 ENCOUNTER — Encounter: Payer: Self-pay | Admitting: Hospice and Palliative Medicine

## 2020-04-30 ENCOUNTER — Other Ambulatory Visit: Payer: Self-pay

## 2020-04-30 ENCOUNTER — Ambulatory Visit: Payer: Managed Care, Other (non HMO) | Admitting: Hospice and Palliative Medicine

## 2020-04-30 VITALS — BP 140/82 | HR 86 | Temp 98.0°F | Resp 16 | Ht 64.0 in | Wt 200.0 lb

## 2020-04-30 DIAGNOSIS — R5383 Other fatigue: Secondary | ICD-10-CM | POA: Diagnosis not present

## 2020-04-30 DIAGNOSIS — R197 Diarrhea, unspecified: Secondary | ICD-10-CM | POA: Diagnosis not present

## 2020-04-30 DIAGNOSIS — I10 Essential (primary) hypertension: Secondary | ICD-10-CM | POA: Diagnosis not present

## 2020-04-30 NOTE — Progress Notes (Signed)
[facility] [address] [place], [place] [address]  Internal MEDICINE  Office Visit Note  Patient Name: Margaret Lynch  [id number]  [id number]  Date of Service: 05/01/2020  Chief Complaint  Patient presents with  . Diarrhea    Started Sunday night, stopped Monday morning started back up at 3pm stopped at 10pm then Tuesday night and no more since.  Feeling fatigue and weakness. No fever     HPI Pt is here for a sick visit. C/o ongoing diarrhea--initially started Sunday evening and has had intermittent episodes throughout the week Does not seem to be related to meals or eating Feels weak and fatigued, has been trying to increase her fluid intake as much as she can tolerate Denies any abdominal pain or tenderness Denies N/V or noticing blood in stool Has remained afebrile  Current Medication:  Outpatient Encounter Medications as of 04/30/2020  Medication Sig  . atenolol (TENORMIN) 25 MG tablet Take 1 tablet (25 mg total) by mouth at bedtime.  . fluticasone (FLONASE) 50 MCG/ACT nasal spray Place 2 sprays into both nostrils daily.  Marland Kitchen ketoconazole (NIZORAL) 2 % cream Apply 1 application topically daily.  Marland Kitchen losartan (COZAAR) 100 MG tablet TAKE 1 TABLET BY MOUTH  DAILY  . omeprazole (PRILOSEC) 40 MG capsule Take 1 capsule (40 mg total) by mouth daily.   No facility-administered encounter medications on file as of 04/30/2020.      Medical History: Past Medical History:  Diagnosis Date  . Acid reflux   . HTN (hypertension)   . Nocturia   . Overweight   . Urinary frequency      Vital Signs: BP 140/82   Pulse 86   Temp 98 F (36.7 C)   Resp 16   Ht 5\' 4"  (1.626 m)   Wt 200 lb (90.7 kg)   SpO2 97%   BMI 34.33 kg/m    Review of Systems  Constitutional: Negative for chills, diaphoresis and fatigue.  HENT: Negative for ear pain, postnasal drip and sinus pressure.   Eyes: Negative for photophobia, discharge, redness, itching and visual disturbance.   Respiratory: Negative for cough, shortness of breath and wheezing.   Cardiovascular: Negative for chest pain, palpitations and leg swelling.  Gastrointestinal: Positive for diarrhea. Negative for abdominal pain, constipation, nausea and vomiting.  Genitourinary: Negative for dysuria and flank pain.  Musculoskeletal: Negative for arthralgias, back pain, gait problem and neck pain.  Skin: Negative for color change.  Allergic/Immunologic: Negative for environmental allergies and food allergies.  Neurological: Negative for dizziness and headaches.  Hematological: Does not bruise/bleed easily.  Psychiatric/Behavioral: Negative for agitation, behavioral problems (depression) and hallucinations.    Physical Exam Vitals reviewed.  Constitutional:      Appearance: Normal appearance. She is obese.  Cardiovascular:     Rate and Rhythm: Normal rate and regular rhythm.     Pulses: Normal pulses.     Heart sounds: Normal heart sounds.  Pulmonary:     Effort: Pulmonary effort is normal.     Breath sounds: Normal breath sounds.  Abdominal:     General: Abdomen is flat. Bowel sounds are normal. There is no distension.     Palpations: Abdomen is soft.     Tenderness: There is no abdominal tenderness. There is no guarding or rebound.  Musculoskeletal:        General: Normal range of motion.  Skin:    General: Skin is warm.  Neurological:     General: No focal deficit present.  Mental Status: She is alert and oriented to person, place, and time. Mental status is at baseline.  Psychiatric:        Mood and Affect: Mood normal.        Behavior: Behavior normal.        Thought Content: Thought content normal.        Judgment: Judgment normal.   Assessment/Plan: 1. Acute diarrhea Will review labs for elevated WBC or signs of dehydration Encouraged to adhere to liquid or semi-liquid diet while having diarrhea, rest and increase fluid intake Will review [place] for underlying etiology Likely viral  in nature - CBC w/Diff/Platelet - Comprehensive Metabolic Panel (CMET) - Lipid Panel With LDL/HDL Ratio - TSH + free T4 - US Abdomen Complete; Future  2. Essential hypertension BP and HR well controlled, continue to monitor  3. Other fatigue - CBC w/Diff/Platelet - Comprehensive Metabolic Panel (CMET) - Lipid Panel With LDL/HDL Ratio - TSH + free T4 - US Abdomen Complete; Future  General Counseling: zunaira lamy understanding of the findings of todays visit and agrees with plan of treatment. I have discussed any further diagnostic evaluation that may be needed or ordered today. We also reviewed her medications today. she has been encouraged to call the office with any questions or concerns that should arise related to todays visit.   Orders Placed This Encounter  Procedures  . US Abdomen Complete  . CBC w/Diff/Platelet  . Comprehensive Metabolic Panel (CMET)  . Lipid Panel With LDL/HDL Ratio  . TSH + free T4    Time spent: 25 Minutes Time spent includes review of chart, medications, test results and follow-up plan with the patient.  This patient was seen by Leeanne Deed AGNP-C in Collaboration with Dr Lyndon Code as a part of collaborative care agreement.  Lubertha Basque [facility] Internal Medicine

## 2020-05-01 ENCOUNTER — Encounter: Payer: Self-pay | Admitting: Hospice and Palliative Medicine

## 2020-05-01 ENCOUNTER — Telehealth: Payer: Self-pay

## 2020-05-01 LAB — CBC WITH DIFFERENTIAL/PLATELET
Basophils Absolute: 0.1 10*3/uL (ref 0.0–0.2)
Basos: 1 %
EOS (ABSOLUTE): 0.1 10*3/uL (ref 0.0–0.4)
Eos: 2 %
Hematocrit: 42.6 % (ref 34.0–46.6)
Hemoglobin: 14.5 g/dL (ref 11.1–15.9)
Immature Grans (Abs): 0 10*3/uL (ref 0.0–0.1)
Immature Granulocytes: 0 %
Lymphocytes Absolute: 0.9 10*3/uL (ref 0.7–3.1)
Lymphs: 14 %
MCH: 31.1 pg (ref 26.6–33.0)
MCHC: 34 g/dL (ref 31.5–35.7)
MCV: 91 fL (ref 79–97)
Monocytes Absolute: 0.6 10*3/uL (ref 0.1–0.9)
Monocytes: 10 %
Neutrophils Absolute: 4.3 10*3/uL (ref 1.4–7.0)
Neutrophils: 73 %
Platelets: 180 10*3/uL (ref 150–450)
RBC: 4.66 x10E6/uL (ref 3.77–5.28)
RDW: 12 % (ref 11.7–15.4)
WBC: 5.9 10*3/uL (ref 3.4–10.8)

## 2020-05-01 LAB — COMPREHENSIVE METABOLIC PANEL
ALT: 32 IU/L (ref 0–32)
AST: 21 IU/L (ref 0–40)
Albumin/Globulin Ratio: 1.5 (ref 1.2–2.2)
Albumin: 4.2 g/dL (ref 3.8–4.9)
Alkaline Phosphatase: 90 IU/L (ref 44–121)
BUN/Creatinine Ratio: 15 (ref 9–23)
BUN: 10 mg/dL (ref 6–24)
Bilirubin Total: 0.3 mg/dL (ref 0.0–1.2)
CO2: 23 mmol/L (ref 20–29)
Calcium: 9.3 mg/dL (ref 8.7–10.2)
Chloride: 99 mmol/L (ref 96–106)
Creatinine, Ser: 0.68 mg/dL (ref 0.57–1.00)
Globulin, Total: 2.8 g/dL (ref 1.5–4.5)
Glucose: 86 mg/dL (ref 65–99)
Potassium: 4.5 mmol/L (ref 3.5–5.2)
Sodium: 142 mmol/L (ref 134–144)
Total Protein: 7 g/dL (ref 6.0–8.5)
eGFR: 102 mL/min/{1.73_m2} (ref >59)

## 2020-05-01 LAB — LIPID PANEL WITH LDL/HDL RATIO
Cholesterol, Total: 100 mg/dL (ref 100–199)
HDL: 39 mg/dL — ABNORMAL LOW (ref >39)
LDL Chol Calc (NIH): 48 mg/dL (ref 0–99)
LDL/HDL Ratio: 1.2 ratio (ref 0.0–3.2)
Triglycerides: 54 mg/dL (ref 0–149)
VLDL Cholesterol Cal: 13 mg/dL (ref 5–40)

## 2020-05-01 LAB — TSH+FREE T4
Free T4: 1.47 ng/dL (ref 0.82–1.77)
TSH: 2.29 u[IU]/mL (ref 0.450–4.500)

## 2020-05-01 NOTE — Telephone Encounter (Signed)
Spoke to pt and informed her of labs.  Pt advised that she is feeling much better and that the diarrhea has stopped completely.  Last time was on Tuesday.

## 2020-05-01 NOTE — Telephone Encounter (Signed)
-----   Message from Theotis Burrow, NP sent at 05/01/2020  8:36 AM EDT ----- Please call and let her know that her labs look good. Ask her if diarrhea is improving?

## 2020-05-01 NOTE — Progress Notes (Signed)
Please call and let her know that her labs look good. Ask her if diarrhea is improving?

## 2020-05-20 ENCOUNTER — Other Ambulatory Visit: Payer: Self-pay | Admitting: Internal Medicine

## 2020-05-20 DIAGNOSIS — I10 Essential (primary) hypertension: Secondary | ICD-10-CM

## 2020-06-05 ENCOUNTER — Other Ambulatory Visit: Payer: Self-pay

## 2020-06-11 ENCOUNTER — Encounter: Payer: Self-pay | Admitting: Internal Medicine

## 2020-06-11 ENCOUNTER — Ambulatory Visit: Payer: Managed Care, Other (non HMO) | Admitting: Internal Medicine

## 2020-06-11 ENCOUNTER — Other Ambulatory Visit: Payer: Self-pay

## 2020-06-11 VITALS — BP 150/82 | HR 63 | Temp 98.0°F | Resp 16 | Ht 64.0 in | Wt 197.0 lb

## 2020-06-11 DIAGNOSIS — I10 Essential (primary) hypertension: Secondary | ICD-10-CM

## 2020-06-11 DIAGNOSIS — K219 Gastro-esophageal reflux disease without esophagitis: Secondary | ICD-10-CM

## 2020-06-11 DIAGNOSIS — Z6833 Body mass index (BMI) 33.0-33.9, adult: Secondary | ICD-10-CM

## 2020-06-11 MED ORDER — OMEPRAZOLE 40 MG PO CPDR: 40.0000 mg | DELAYED_RELEASE_CAPSULE | Freq: Every day | ORAL | 1 refills | Status: DC

## 2020-06-11 NOTE — Progress Notes (Signed)
[facility] [address] [place], [place] [address]  Internal MEDICINE  Office Visit Note  Patient Name: Margaret Lynch  [id number]  [id number]  Date of Service: 06/18/2020  Chief Complaint  Patient presents with  . Follow-up  . Hypertension  . Gastroesophageal Reflux    HPI Patient is here for routine follow-up, she denies any major complaints she would like to have a refill for omeprazole. reflux is well controlled with this medication Blood pressure is slightly elevated she does not keep a record at home does take [place] and losartan regularly. She is a little concerned about her weight gain and has been working on it. Denies any chest pain shortness of breath   Current Medication: Outpatient Encounter Medications as of 06/11/2020  Medication Sig  . atenolol (TENORMIN) 25 MG tablet TAKE 1 TABLET BY MOUTH AT  BEDTIME  . fluticasone (FLONASE) 50 MCG/ACT nasal spray Place 2 sprays into both nostrils daily.  Marland Kitchen ketoconazole (NIZORAL) 2 % cream Apply 1 application topically daily.  Marland Kitchen losartan (COZAAR) 100 MG tablet TAKE 1 TABLET BY MOUTH  DAILY  . [DISCONTINUED] omeprazole (PRILOSEC) 40 MG capsule Take 1 capsule (40 mg total) by mouth daily.  Marland Kitchen omeprazole (PRILOSEC) 40 MG capsule Take 1 capsule (40 mg total) by mouth daily.   No facility-administered encounter medications on file as of 06/11/2020.    Surgical History: Past Surgical History:  Procedure Laterality Date  . COLONOSCOPY WITH PROPOFOL    . COLONOSCOPY WITH PROPOFOL N/A 01/15/2019   Procedure: COLONOSCOPY WITH PROPOFOL;  Surgeon: Wyline Mood, MD;  Location: [facility] ENDOSCOPY;  Service: Gastroenterology;  Laterality: N/A;    Medical History: Past Medical History:  Diagnosis Date  . Acid reflux   . HTN (hypertension)   . Nocturia   . Overweight   . Urinary frequency     Family History: Family History  Problem Relation Age of Onset  . Hypertension Mother   . Diabetes Mother   . Esophageal  cancer Father   . Kidney disease Neg Hx   . Prostate cancer Neg Hx   . Bladder Cancer Neg Hx   . Breast cancer Neg Hx     Social History   Socioeconomic History  . Marital status: Single    Spouse name: Not on file  . Number of children: Not on file  . Years of education: Not on file  . Highest education level: Not on file  Occupational History  . Not on file  Tobacco Use  . Smoking status: Never Smoker  . Smokeless tobacco: Never Used  Vaping Use  . Vaping Use: Never used  Substance and Sexual Activity  . Alcohol use: No    Alcohol/week: 0.0 standard drinks  . Drug use: No  . Sexual activity: Not on file  Other Topics Concern  . Not on file  Social History Narrative  . Not on file   Social Determinants of Health   Financial Resource Strain: Not on file  Food Insecurity: Not on file  Transportation Needs: Not on file  Physical Activity: Not on file  Stress: Not on file  Social Connections: Not on file  Intimate Partner Violence: Not on file      Review of Systems  Constitutional: Negative for chills, fatigue and unexpected weight change.  HENT: Positive for postnasal drip. Negative for congestion, rhinorrhea, sneezing and sore throat.   Eyes: Negative for redness.  Respiratory: Negative for cough, chest tightness and shortness of breath.   Cardiovascular:  Negative for chest pain and palpitations.  Gastrointestinal: Negative for abdominal pain, constipation, diarrhea, nausea and vomiting.  Genitourinary: Negative for dysuria and frequency.  Musculoskeletal: Negative for arthralgias, back pain, joint swelling and neck pain.  Skin: Negative for rash.  Neurological: Negative.  Negative for tremors and numbness.  Hematological: Negative for adenopathy. Does not bruise/bleed easily.  Psychiatric/Behavioral: Negative for behavioral problems (Depression), sleep disturbance and suicidal ideas. The patient is not nervous/anxious.     Vital Signs: BP (!) 150/82    Pulse 63   Temp 98 F (36.7 C)   Resp 16   Ht 5\' 4"  (1.626 m)   Wt 197 lb (89.4 kg)   SpO2 98%   BMI 33.81 kg/m    Physical Exam Constitutional:      Appearance: Normal appearance.  HENT:     Head: Normocephalic and atraumatic.     Nose: Nose normal.     Mouth/Throat:     Mouth: Mucous membranes are moist.     Pharynx: No posterior oropharyngeal erythema.  Eyes:     Extraocular Movements: Extraocular movements intact.     Pupils: Pupils are equal, round, and reactive to light.  Cardiovascular:     Rate and Rhythm: Normal rate and regular rhythm.     Pulses: Normal pulses.     Heart sounds: Normal heart sounds.  Pulmonary:     Effort: Pulmonary effort is normal.     Breath sounds: Normal breath sounds.  Neurological:     General: No focal deficit present.     Mental Status: She is alert.  Psychiatric:        Mood and Affect: Mood normal.        Behavior: Behavior normal.        Assessment/Plan: 1. Gastroesophageal reflux disease without esophagitis Reflux symptoms are well controlled omeprazole 40 g once a day refills given - omeprazole (PRILOSEC) 40 MG capsule; Take 1 capsule (40 mg total) by mouth daily.  Dispense: 90 capsule; Refill: 1  2. Benign essential HTN Initial blood pressure is slightly elevated patient is instructed to keep home blood pressure log and bring it with her continue medications as before  3. Adult BMI 33.0-33.9 kg/sq m Obesity Counseling: Risk Assessment: An assessment of behavioral risk factors was made today and includes lack of exercise sedentary lifestyle, lack of portion control and poor dietary habits.  Risk Modification Advice: She was counseled on portion control guidelines. Restricting daily caloric intake to 1500 The detrimental long term effects of obesity on her health and ongoing poor compliance was also discussed with the patient.    General Counseling: christean silvestri understanding of the findings of todays visit and  agrees with plan of treatment. I have discussed any further diagnostic evaluation that may be needed or ordered today. We also reviewed her medications today. she has been encouraged to call the office with any questions or concerns that should arise related to todays visit.    No orders of the defined types were placed in this encounter.   Meds ordered this encounter  Medications  . omeprazole (PRILOSEC) 40 MG capsule    Sig: Take 1 capsule (40 mg total) by mouth daily.    Dispense:  90 capsule    Refill:  1    Total time spent: 25 Minutes Time spent includes review of chart, medications, test results, and follow up plan with the patient.   [place] Controlled Substance Database was reviewed by me.   Dr [name]  [facility] Internal medicine

## 2020-06-17 ENCOUNTER — Ambulatory Visit: Payer: Managed Care, Other (non HMO) | Admitting: Internal Medicine

## 2020-06-18 ENCOUNTER — Other Ambulatory Visit: Payer: Self-pay | Admitting: Internal Medicine

## 2020-06-18 DIAGNOSIS — I10 Essential (primary) hypertension: Secondary | ICD-10-CM

## 2020-07-02 ENCOUNTER — Other Ambulatory Visit: Payer: Self-pay

## 2020-07-02 ENCOUNTER — Ambulatory Visit: Payer: Managed Care, Other (non HMO)

## 2020-07-02 DIAGNOSIS — R5383 Other fatigue: Secondary | ICD-10-CM | POA: Diagnosis not present

## 2020-07-02 DIAGNOSIS — R197 Diarrhea, unspecified: Secondary | ICD-10-CM

## 2020-07-23 ENCOUNTER — Encounter: Payer: Self-pay | Admitting: Nurse Practitioner

## 2020-07-23 ENCOUNTER — Ambulatory Visit: Payer: Managed Care, Other (non HMO) | Admitting: Nurse Practitioner

## 2020-07-23 ENCOUNTER — Other Ambulatory Visit: Payer: Self-pay

## 2020-07-23 VITALS — BP 132/92 | HR 60 | Temp 98.3°F | Resp 16 | Ht 64.0 in | Wt 198.2 lb

## 2020-07-23 DIAGNOSIS — K8020 Calculus of gallbladder without cholecystitis without obstruction: Secondary | ICD-10-CM

## 2020-07-23 DIAGNOSIS — K760 Fatty (change of) liver, not elsewhere classified: Secondary | ICD-10-CM | POA: Diagnosis not present

## 2020-07-23 DIAGNOSIS — K219 Gastro-esophageal reflux disease without esophagitis: Secondary | ICD-10-CM | POA: Diagnosis not present

## 2020-07-23 DIAGNOSIS — I10 Essential (primary) hypertension: Secondary | ICD-10-CM

## 2020-07-23 NOTE — Progress Notes (Signed)
[facility] [address] [place], [place] [address]  Internal MEDICINE  Office Visit Note  Patient Name: Margaret Lynch  [id number]  [id number]  Date of Service: 08/03/2020  Chief Complaint  Patient presents with   Follow-up    Ultrasound results    HPI Margaret Lynch presents for a follow up visit to review ultrasound results. She was seen in March 2022 for acute diarrhea. At the time there were no signs of acute infection. She had an abdominal ultrasound to investigate possible etiology for diarrhea. The ultrasound was ordered on April 30, 2020 but was not done until July 02, 2020. The result of the abdominal ultrasound showed mild hepatic steatosis and cholelithiasis without obstruction.    Current Medication: Outpatient Encounter Medications as of 07/23/2020  Medication Sig   atenolol (TENORMIN) 25 MG tablet TAKE 1 TABLET BY MOUTH AT  BEDTIME   fluticasone (FLONASE) 50 MCG/ACT nasal spray Place 2 sprays into both nostrils daily.   ketoconazole (NIZORAL) 2 % cream Apply 1 application topically daily.   losartan (COZAAR) 100 MG tablet TAKE 1 TABLET BY MOUTH  DAILY   omeprazole (PRILOSEC) 40 MG capsule Take 1 capsule (40 mg total) by mouth daily.   No facility-administered encounter medications on file as of 07/23/2020.    Surgical History: Past Surgical History:  Procedure Laterality Date   COLONOSCOPY WITH PROPOFOL     COLONOSCOPY WITH PROPOFOL N/A 01/15/2019   Procedure: COLONOSCOPY WITH PROPOFOL;  Surgeon: Wyline Mood, MD;  Location: [facility] ENDOSCOPY;  Service: Gastroenterology;  Laterality: N/A;    Medical History: Past Medical History:  Diagnosis Date   Acid reflux    HTN (hypertension)    Nocturia    Overweight    Urinary frequency     Family History: Family History  Problem Relation Age of Onset   Hypertension Mother    Diabetes Mother    Esophageal cancer Father    Kidney disease Neg Hx    Prostate cancer Neg Hx    Bladder Cancer Neg Hx    Breast  cancer Neg Hx     Social History   Socioeconomic History   Marital status: Single    Spouse name: Not on file   Number of children: Not on file   Years of education: Not on file   Highest education level: Not on file  Occupational History   Not on file  Tobacco Use   Smoking status: Never   Smokeless tobacco: Never  Vaping Use   Vaping Use: Never used  Substance and Sexual Activity   Alcohol use: No    Alcohol/week: 0.0 standard drinks   Drug use: No   Sexual activity: Not on file  Other Topics Concern   Not on file  Social History Narrative   Not on file   Social Determinants of Health   Financial Resource Strain: Not on file  Food Insecurity: Not on file  Transportation Needs: Not on file  Physical Activity: Not on file  Stress: Not on file  Social Connections: Not on file  Intimate Partner Violence: Not on file      Review of Systems  Vital Signs: BP (!) 132/92   Pulse 60   Temp 98.3 F (36.8 C)   Resp 16   Ht 5\' 4"  (1.626 m)   Wt 198 lb 3.2 oz (89.9 kg)   SpO2 99%   BMI 34.02 kg/m    Physical Exam     Assessment/Plan: 1. Calculus of gallbladder without  cholecystitis without obstruction Discussed with patient. Patient instructed to call if she starts have abdominal pain esp. RUQ pain after eating or if the pain becomes constant regardless of when she ate. Informed patient that she does not have any obstruction or signs of infection now but that gallstones can cause obstruction.   2. Hepatic steatosis Mild, incidental finding on abdominal ultrasound, will monitor.  3. Essential hypertension Patient was nervous about finding out her results today, blood pressure was elevated. Taking atenolol and losartan, she does not need refills. Continue to monitor.   4. Gastroesophageal reflux disease without esophagitis Stable, taking omeprazole, no refills needed.    General Counseling: Margaret Lynch understanding of the findings of todays visit and  agrees with plan of treatment. I have discussed any further diagnostic evaluation that may be needed or ordered today. We also reviewed her medications today. she has been encouraged to call the office with any questions or concerns that should arise related to todays visit.    No orders of the defined types were placed in this encounter.   No orders of the defined types were placed in this encounter.   Return in about 5 months (around 12/23/2020) for F/U, Margaret Lynch PCP previously scheduled in November.   Total time spent:30 Minutes Time spent includes review of chart, medications, test results, and follow up plan with the patient.   [place] Controlled Substance Database was reviewed by me.  This patient was seen by Sallyanne Kuster, FNP-C in collaboration with Dr. Beverely Risen as a part of collaborative care agreement.   Kacy Hegna R. Tedd Sias, MSN, FNP-C Internal medicine

## 2020-11-28 ENCOUNTER — Encounter: Payer: Self-pay | Admitting: Nurse Practitioner

## 2020-11-28 ENCOUNTER — Telehealth: Payer: Managed Care, Other (non HMO) | Admitting: Nurse Practitioner

## 2020-11-28 ENCOUNTER — Telehealth: Payer: Self-pay

## 2020-11-28 ENCOUNTER — Other Ambulatory Visit: Payer: Self-pay

## 2020-11-28 VITALS — Resp 16 | Ht 65.0 in | Wt 196.5 lb

## 2020-11-28 DIAGNOSIS — U071 COVID-19: Secondary | ICD-10-CM

## 2020-11-28 DIAGNOSIS — J069 Acute upper respiratory infection, unspecified: Secondary | ICD-10-CM

## 2020-11-28 DIAGNOSIS — R051 Acute cough: Secondary | ICD-10-CM | POA: Diagnosis not present

## 2020-11-28 MED ORDER — NIRMATRELVIR/RITONAVIR (PAXLOVID)TABLET: 3.0000 | ORAL_TABLET | Freq: Two times a day (BID) | ORAL | 0 refills | Status: AC

## 2020-11-28 MED ORDER — BENZONATATE 100 MG PO CAPS: 100.0000 mg | ORAL_CAPSULE | Freq: Two times a day (BID) | ORAL | 0 refills | Status: DC | PRN

## 2020-11-28 NOTE — Telephone Encounter (Signed)
Pt called that she tested positive for COVID made her virtual appt today

## 2020-11-28 NOTE — Progress Notes (Signed)
[facility] [address] [place], [place] [address]  Internal MEDICINE  Telephone Visit  Patient Name: Margaret Lynch  [id number]  [id number]  Date of Service: 11/28/2020  I connected with the patient at 11:30 AM by telephone and verified the patients identity using two identifiers.   I discussed the limitations, risks, security and privacy concerns of performing an evaluation and management service by telephone and the availability of in person appointments. I also discussed with the patient that there may be a patient responsible charge related to the service.  The patient expressed understanding and agrees to proceed.    Chief Complaint  Patient presents with   Acute Visit    Covid positive, achy, runny nose   Telephone Assessment    Video or phone is ok    Telephone Screen    [phone]    Sore Throat   Cough    HPI [name] presents for a telehealth virtual visit for testing COVID positive. She has body aches, runny nose, sore throat and cough. She denies any fever, SOB, chest tightness or wheezing.    Current Medication: Outpatient Encounter Medications as of 11/28/2020  Medication Sig   atenolol (TENORMIN) 25 MG tablet TAKE 1 TABLET BY MOUTH AT  BEDTIME   benzonatate (TESSALON) 100 MG capsule Take 1 capsule (100 mg total) by mouth 2 (two) times daily as needed for cough.   fluticasone (FLONASE) 50 MCG/ACT nasal spray Place 2 sprays into both nostrils daily.   ketoconazole (NIZORAL) 2 % cream Apply 1 application topically daily.   losartan (COZAAR) 100 MG tablet TAKE 1 TABLET BY MOUTH  DAILY   nirmatrelvir/ritonavir EUA (PAXLOVID) 20 x 150 MG & 10 x 100MG  TABS Take 3 tablets by mouth 2 (two) times daily for 5 days. May take with or without food   omeprazole (PRILOSEC) 40 MG capsule Take 1 capsule (40 mg total) by mouth daily.   No facility-administered encounter medications on file as of 11/28/2020.    Surgical History: Past Surgical History:   Procedure Laterality Date   COLONOSCOPY WITH PROPOFOL     COLONOSCOPY WITH PROPOFOL N/A 01/15/2019   Procedure: COLONOSCOPY WITH PROPOFOL;  Surgeon: 01/17/2019, MD;  Location: [facility] ENDOSCOPY;  Service: Gastroenterology;  Laterality: N/A;    Medical History: Past Medical History:  Diagnosis Date   Acid reflux    HTN (hypertension)    Nocturia    Overweight    Urinary frequency     Family History: Family History  Problem Relation Age of Onset   Hypertension Mother    Diabetes Mother    Esophageal cancer Father    Kidney disease Neg Hx    Prostate cancer Neg Hx    Bladder Cancer Neg Hx    Breast cancer Neg Hx     Social History   Socioeconomic History   Marital status: Single    Spouse name: Not on file   Number of children: Not on file   Years of education: Not on file   Highest education level: Not on file  Occupational History   Not on file  Tobacco Use   Smoking status: Never   Smokeless tobacco: Never  Vaping Use   Vaping Use: Never used  Substance and Sexual Activity   Alcohol use: No    Alcohol/week: 0.0 standard drinks   Drug use: No   Sexual activity: Not on file  Other Topics Concern   Not on file  Social History Narrative   Not  on file   Social Determinants of Health   Financial Resource Strain: Not on file  Food Insecurity: Not on file  Transportation Needs: Not on file  Physical Activity: Not on file  Stress: Not on file  Social Connections: Not on file  Intimate Partner Violence: Not on file      Review of Systems  Constitutional:  Positive for fatigue. Negative for appetite change, chills and fever.  HENT:  Positive for congestion, postnasal drip, rhinorrhea, sinus pressure, sinus pain and sore throat. Negative for ear pain, sneezing and trouble swallowing.   Respiratory:  Positive for cough. Negative for chest tightness, shortness of breath and wheezing.   Cardiovascular: Negative.  Negative for chest pain and palpitations.   Gastrointestinal:  Negative for abdominal pain, constipation, diarrhea, nausea and vomiting.  Musculoskeletal:  Positive for myalgias.  Skin: Negative.  Negative for rash.  Neurological:  Positive for headaches.   Vital Signs: Resp 16   Ht 5\' 5"  (1.651 m)   Wt 196 lb 8 oz (89.1 kg)   BMI 32.70 kg/m    Observation/Objective: She is alert and oriented and engages in conversation appropriately. Her voice sounds hoarse over video call but she does not appear to be in any acute distress.     Assessment/Plan: 1. Upper respiratory tract infection due to COVID-19 virus Paxlovid prescribed for covid infection - nirmatrelvir/ritonavir EUA (PAXLOVID) 20 x 150 MG & 10 x 100MG  TABS; Take 3 tablets by mouth 2 (two) times daily for 5 days. May take with or without food  Dispense: 30 tablet; Refill: 0  2. Acute cough Benzonatate prescribed for symptomatic relief of cough.  - benzonatate (TESSALON) 100 MG capsule; Take 1 capsule (100 mg total) by mouth 2 (two) times daily as needed for cough.  Dispense: 30 capsule; Refill: 0   General Counseling: understanding of the findings of today's phone visit and agrees with plan of treatment. I have discussed any further diagnostic evaluation that may be needed or ordered today. We also reviewed her medications today. she has been encouraged to call the office with any questions or concerns that should arise related to todays visit.  Return if symptoms worsen or fail to improve.   No orders of the defined types were placed in this encounter.   Meds ordered this encounter  Medications   nirmatrelvir/ritonavir EUA (PAXLOVID) 20 x 150 MG & 10 x 100MG  TABS    Sig: Take 3 tablets by mouth 2 (two) times daily for 5 days. May take with or without food    Dispense:  30 tablet    Refill:  0   benzonatate (TESSALON) 100 MG capsule    Sig: Take 1 capsule (100 mg total) by mouth 2 (two) times daily as needed for cough.    Dispense:  30  capsule    Refill:  0     Time spent:10 Minutes Time spent with patient included reviewing progress notes, labs, imaging studies, and discussing plan for follow up.   Controlled Substance Database was reviewed by me for overdose risk score (ORS) if appropriate.  This patient was seen by , FNP-C in collaboration with Dr. [name] as a part of collaborative care agreement.  Aundria Bitterman R. , MSN, FNP-C Internal medicine

## 2020-12-03 ENCOUNTER — Other Ambulatory Visit: Payer: Self-pay

## 2020-12-03 DIAGNOSIS — J3481 Nasal mucositis (ulcerative): Secondary | ICD-10-CM

## 2020-12-03 MED ORDER — FLUTICASONE PROPIONATE 50 MCG/ACT NA SUSP: 2.0000 | Freq: Every day | NASAL | 3 refills | Status: DC

## 2020-12-23 ENCOUNTER — Other Ambulatory Visit: Payer: Managed Care, Other (non HMO) | Admitting: Nurse Practitioner

## 2020-12-24 ENCOUNTER — Other Ambulatory Visit: Payer: Self-pay

## 2020-12-24 ENCOUNTER — Other Ambulatory Visit: Payer: Self-pay | Admitting: Nurse Practitioner

## 2020-12-24 ENCOUNTER — Ambulatory Visit (INDEPENDENT_AMBULATORY_CARE_PROVIDER_SITE_OTHER): Payer: Managed Care, Other (non HMO) | Admitting: Nurse Practitioner

## 2020-12-24 ENCOUNTER — Encounter: Payer: Self-pay | Admitting: Nurse Practitioner

## 2020-12-24 VITALS — BP 150/70 | HR 65 | Temp 98.4°F | Resp 16 | Ht 64.0 in | Wt 196.0 lb

## 2020-12-24 DIAGNOSIS — E782 Mixed hyperlipidemia: Secondary | ICD-10-CM | POA: Diagnosis not present

## 2020-12-24 DIAGNOSIS — K760 Fatty (change of) liver, not elsewhere classified: Secondary | ICD-10-CM

## 2020-12-24 DIAGNOSIS — E559 Vitamin D deficiency, unspecified: Secondary | ICD-10-CM | POA: Diagnosis not present

## 2020-12-24 DIAGNOSIS — Z0001 Encounter for general adult medical examination with abnormal findings: Secondary | ICD-10-CM | POA: Diagnosis not present

## 2020-12-24 DIAGNOSIS — Z124 Encounter for screening for malignant neoplasm of cervix: Secondary | ICD-10-CM

## 2020-12-24 DIAGNOSIS — Z113 Encounter for screening for infections with a predominantly sexual mode of transmission: Secondary | ICD-10-CM

## 2020-12-24 DIAGNOSIS — Z23 Encounter for immunization: Secondary | ICD-10-CM

## 2020-12-24 DIAGNOSIS — I10 Essential (primary) hypertension: Secondary | ICD-10-CM

## 2020-12-24 DIAGNOSIS — R300 Dysuria: Secondary | ICD-10-CM

## 2020-12-24 MED ORDER — TETANUS-DIPHTH-ACELL PERTUSSIS 5-2.5-18.5 LF-MCG/0.5 IM SUSP: 0.5000 mL | Freq: Once | INTRAMUSCULAR | 0 refills | Status: AC

## 2020-12-24 MED ORDER — AMLODIPINE BESYLATE 2.5 MG PO TABS: 2.5000 mg | ORAL_TABLET | Freq: Every day | ORAL | 1 refills | Status: DC

## 2020-12-24 NOTE — Progress Notes (Signed)
[facility] [place], [place] [address]  Internal MEDICINE  Office Visit Note  Patient Name: Margaret Lynch  [id number]  [id number]  Date of Service: 12/24/2020  Chief Complaint  Patient presents with   Annual Exam   Gastroesophageal Reflux   Hypertension    HPI Margaret Lynch presents for an annual well visit and physical exam. She is a well appearing 55 yo female. She was seen in June to discuss her abdominal ultrasound which did should mild hepatic steatosis and cholelithiasis without obstruction. She has not had any further issues with this problem and was previously instructed on what to do and what to look for in case her symptoms progress or get worse. She had a covid infection in October and is feeling better now, residual symptoms are manageable per patient. She is due for a mammogram in January. Her pap is due now. And her routine colonoscopy is not due until 2025.  She is due for routine labs.  Her blood pressure is elevated. Currently on losartan at max dose of 100 mg daily and on atenolol 25 mg daily with a heart rate of 65 bpm.     Current Medication: Outpatient Encounter Medications as of 12/24/2020  Medication Sig   amLODipine (NORVASC) 2.5 MG tablet Take 1 tablet (2.5 mg total) by mouth daily.   atenolol (TENORMIN) 25 MG tablet TAKE 1 TABLET BY MOUTH AT  BEDTIME   fluticasone (FLONASE) 50 MCG/ACT nasal spray Place 2 sprays into both nostrils daily.   ketoconazole (NIZORAL) 2 % cream Apply 1 application topically daily.   losartan (COZAAR) 100 MG tablet TAKE 1 TABLET BY MOUTH  DAILY   omeprazole (PRILOSEC) 40 MG capsule Take 1 capsule (40 mg total) by mouth daily.   [DISCONTINUED] Tdap (BOOSTRIX) 5-2.5-18.5 LF-MCG/0.5 injection Inject 0.5 mLs into the muscle once.   [EXPIRED] Tdap (BOOSTRIX) 5-2.5-18.5 LF-MCG/0.5 injection Inject 0.5 mLs into the muscle once for 1 dose.   [DISCONTINUED] benzonatate (TESSALON) 100 MG capsule Take 1 capsule (100 mg  total) by mouth 2 (two) times daily as needed for cough. (Patient not taking: Reported on 12/24/2020)   No facility-administered encounter medications on file as of 12/24/2020.    Surgical History: Past Surgical History:  Procedure Laterality Date   COLONOSCOPY WITH PROPOFOL     COLONOSCOPY WITH PROPOFOL N/A 01/15/2019   Procedure: COLONOSCOPY WITH PROPOFOL;  Surgeon: Jonathon Bellows, MD;  Location: [facility] ENDOSCOPY;  Service: Gastroenterology;  Laterality: N/A;    Medical History: Past Medical History:  Diagnosis Date   Acid reflux    HTN (hypertension)    Nocturia    Overweight    Urinary frequency     Family History: Family History  Problem Relation Age of Onset   Hypertension Mother    Diabetes Mother    Esophageal cancer Father    Kidney disease Neg Hx    Prostate cancer Neg Hx    Bladder Cancer Neg Hx    Breast cancer Neg Hx     Social History   Socioeconomic History   Marital status: Single    Spouse name: Not on file   Number of children: Not on file   Years of education: Not on file   Highest education level: Not on file  Occupational History   Not on file  Tobacco Use   Smoking status: Never   Smokeless tobacco: Never  Vaping Use   Vaping Use: Never used  Substance and Sexual Activity   Alcohol use:  No    Alcohol/week: 0.0 standard drinks   Drug use: No   Sexual activity: Not on file  Other Topics Concern   Not on file  Social History Narrative   Not on file   Social Determinants of Health   Financial Resource Strain: Not on file  Food Insecurity: Not on file  Transportation Needs: Not on file  Physical Activity: Not on file  Stress: Not on file  Social Connections: Not on file  Intimate Partner Violence: Not on file      Review of Systems  Constitutional:  Negative for activity change, appetite change, chills, fatigue, fever and unexpected weight change.  HENT: Negative.  Negative for congestion, ear pain, rhinorrhea, sore throat and  trouble swallowing.   Eyes: Negative.   Respiratory: Negative.  Negative for cough, chest tightness, shortness of breath and wheezing.   Cardiovascular: Negative.  Negative for chest pain.  Gastrointestinal: Negative.  Negative for abdominal pain, blood in stool, constipation, diarrhea, nausea and vomiting.  Endocrine: Negative.   Genitourinary: Negative.  Negative for difficulty urinating, dysuria, frequency, hematuria and urgency.  Musculoskeletal: Negative.  Negative for arthralgias, back pain, joint swelling, myalgias and neck pain.  Skin: Negative.  Negative for rash and wound.  Allergic/Immunologic: Negative.  Negative for immunocompromised state.  Neurological: Negative.  Negative for dizziness, seizures, numbness and headaches.  Hematological: Negative.   Psychiatric/Behavioral: Negative.  Negative for behavioral problems, self-injury and suicidal ideas. The patient is not nervous/anxious.    Vital Signs: BP (!) 150/70 Comment: 177/87  Pulse 65   Temp 98.4 F (36.9 C)   Resp 16   Ht _0  (1.626 m)   Wt 196 lb (88.9 kg)   SpO2 99%   BMI 33.64 kg/m    Physical Exam Vitals reviewed.  Constitutional:      General: She is not in acute distress.    Appearance: Normal appearance. She is well-developed. She is obese. She is not ill-appearing or diaphoretic.  HENT:     Head: Normocephalic and atraumatic.     Right Ear: Tympanic membrane, ear canal and external ear normal.     Left Ear: Tympanic membrane, ear canal and external ear normal.     Nose: Nose normal. No congestion or rhinorrhea.     Mouth/Throat:     Mouth: Mucous membranes are moist.     Pharynx: Oropharynx is clear. No oropharyngeal exudate or posterior oropharyngeal erythema.  Eyes:     General: No scleral icterus.       Right eye: No discharge.        Left eye: No discharge.     Extraocular Movements: Extraocular movements intact.     Conjunctiva/sclera: Conjunctivae normal.     Pupils: Pupils are equal,  round, and reactive to light.  Neck:     Thyroid: No thyromegaly.     Vascular: No carotid bruit or JVD.     Trachea: No tracheal deviation.  Cardiovascular:     Rate and Rhythm: Normal rate and regular rhythm.     Pulses: Normal pulses.     Heart sounds: Normal heart sounds. No murmur heard.   No friction rub. No gallop.  Pulmonary:     Effort: Pulmonary effort is normal. No respiratory distress.     Breath sounds: Normal breath sounds. No stridor. No wheezing or rales.  Chest:     Chest wall: No tenderness.  Abdominal:     General: Bowel sounds are normal. There is no distension.  Palpations: Abdomen is soft. There is no mass.     Tenderness: There is no abdominal tenderness. There is no guarding or rebound.     Hernia: There is no hernia in the left inguinal area or right inguinal area.  Genitourinary:    General: Normal vulva.     Exam position: Lithotomy position.     Labia:        Right: No rash, tenderness, lesion or injury.        Left: No rash, tenderness, lesion or injury.      Urethra: No prolapse or urethral swelling.     Vagina: Normal. No signs of injury and foreign body. No vaginal discharge, erythema, tenderness, bleeding, lesions or prolapsed vaginal walls.     Cervix: No cervical motion tenderness, discharge, friability, lesion, erythema, cervical bleeding or eversion.     Uterus: Normal. Not deviated, not enlarged, not fixed and no uterine prolapse.      Adnexa: Right adnexa normal and left adnexa normal.     Rectum: Normal. No tenderness, anal fissure or external hemorrhoid.  Musculoskeletal:        General: No tenderness or deformity. Normal range of motion.     Cervical back: Normal range of motion and neck supple.  Lymphadenopathy:     Cervical: No cervical adenopathy.     Lower Body: No right inguinal adenopathy. No left inguinal adenopathy.  Skin:    General: Skin is warm and dry.     Capillary Refill: Capillary refill takes less than 2 seconds.      Coloration: Skin is not pale.     Findings: No erythema or rash.  Neurological:     Mental Status: She is alert and oriented to person, place, and time.     Cranial Nerves: No cranial nerve deficit.     Motor: No abnormal muscle tone.     Coordination: Coordination normal.     Deep Tendon Reflexes: Reflexes are normal and symmetric.  Psychiatric:        Mood and Affect: Mood normal.        Behavior: Behavior normal.        Thought Content: Thought content normal.        Judgment: Judgment normal.       Assessment/Plan: 1. Encounter for general adult medical examination with abnormal findings Age-appropriate preventive screenings and vaccinations discussed, annual physical exam completed. Routine labs for health maintenance ordered, see below. PHM updated. Routine preventive screening ordered see problems below.   2. Essential hypertension Amlodipine 2.5 mg daily added to BP medications, follow up in 1 month.  - amLODipine (NORVASC) 2.5 MG tablet; Take 1 tablet (2.5 mg total) by mouth daily.  Dispense: 30 tablet; Refill: 1  3. Hepatic steatosis Routine lab ordered.  - CMP14+EGFR  4. Mixed hyperlipidemia Routine lab ordered.  - Lipid Profile  5. Vitamin D deficiency Routine lab ordered. - Vitamin D (25 hydroxy)  6. Dysuria Routine urinalysis done - UA/M w/rflx Culture, Routine - Microscopic Examination - Urine Culture, Reflex  7. Screening for malignant neoplasm of cervix Routine pap done  - IGP, Aptima HPV  8. Screening for STD (sexually transmitted disease) Nuswab vaginal specimen obtained.  - NuSwab Vaginitis Plus (VG+)  9. Need for vaccination - Tdap ([place]) 5-2.5-18.5 LF-MCG/0.5 injection; Inject 0.5 mLs into the muscle once for 1 dose.  Dispense: 0.5 mL; Refill: 0      General Counseling: Jamice verbalizes understanding of the findings of todays visit and agrees  with plan of treatment. I have discussed any further diagnostic evaluation that may be  needed or ordered today. We also reviewed her medications today. she has been encouraged to call the office with any questions or concerns that should arise related to todays visit.    Orders Placed This Encounter  Procedures   Microscopic Examination   Urine Culture, Reflex   UA/M w/rflx Culture, Routine   Lipid Profile   CMP14+EGFR   Vitamin D (25 hydroxy)   NuSwab Vaginitis Plus (VG+)    Meds ordered this encounter  Medications   Tdap (BOOSTRIX) 5-2.5-18.5 LF-MCG/0.5 injection    Sig: Inject 0.5 mLs into the muscle once for 1 dose.    Dispense:  0.5 mL    Refill:  0   amLODipine (NORVASC) 2.5 MG tablet    Sig: Take 1 tablet (2.5 mg total) by mouth daily.    Dispense:  30 tablet    Refill:  1    Return in about 1 month (around 01/23/2021) for F/U, BP check, Athen Riel PCP.   Total time spent:30 Minutes Time spent includes review of chart, medications, test results, and follow up plan with the patient.   [place] Controlled Substance Database was reviewed by me.  This patient was seen by Jonetta Osgood, FNP-C in collaboration with Dr. Clayborn Bigness as a part of collaborative care agreement.  Galan Ghee R. Valetta Fuller, MSN, FNP-C Internal medicine

## 2020-12-26 LAB — NUSWAB VAGINITIS PLUS (VG+)
Candida albicans, NAA: NEGATIVE
Candida glabrata, NAA: NEGATIVE
Chlamydia trachomatis, NAA: NEGATIVE
Neisseria gonorrhoeae, NAA: NEGATIVE
Trich vag by NAA: NEGATIVE

## 2020-12-27 LAB — IGP, APTIMA HPV: HPV Aptima: NEGATIVE

## 2020-12-29 ENCOUNTER — Telehealth: Payer: Self-pay

## 2020-12-29 LAB — UA/M W/RFLX CULTURE, ROUTINE
Bilirubin, UA: NEGATIVE
Glucose, UA: NEGATIVE
Ketones, UA: NEGATIVE
Nitrite, UA: NEGATIVE
Protein,UA: NEGATIVE
Specific Gravity, UA: 1.02 (ref 1.005–1.030)
Urobilinogen, Ur: 0.2 mg/dL (ref 0.2–1.0)
pH, UA: 6.5 (ref 5.0–7.5)

## 2020-12-29 LAB — MICROSCOPIC EXAMINATION
Bacteria, UA: NONE SEEN
Casts: NONE SEEN /lpf

## 2020-12-29 LAB — URINE CULTURE, REFLEX

## 2020-12-29 NOTE — Progress Notes (Signed)
Please call the patient and let her know that her pap was negative for intraepithelial lesion and/or malignancy and HPV testing was negative as well. Her Nuswab was negative for sexually transmitted infections, bacterial vaginosis and yeast. Pap smear will be repeated in 5 years.  

## 2020-12-29 NOTE — Telephone Encounter (Signed)
-----   Message from Sallyanne Kuster, NP sent at 12/29/2020  7:56 AM EST ----- Please call the patient and let her know that her pap was negative for intraepithelial lesion and/or malignancy and HPV testing was negative as well. Her Nuswab was negative for sexually transmitted infections, bacterial vaginosis and yeast. Pap smear will be repeated in 5 years.

## 2020-12-30 LAB — LIPID PANEL
Chol/HDL Ratio: 3.2 ratio (ref 0.0–4.4)
Cholesterol, Total: 141 mg/dL (ref 100–199)
HDL: 44 mg/dL (ref >39)
LDL Chol Calc (NIH): 85 mg/dL (ref 0–99)
Triglycerides: 55 mg/dL (ref 0–149)
VLDL Cholesterol Cal: 12 mg/dL (ref 5–40)

## 2020-12-30 LAB — CMP14+EGFR
ALT: 16 IU/L (ref 0–32)
AST: 13 IU/L (ref 0–40)
Albumin/Globulin Ratio: 2 (ref 1.2–2.2)
Albumin: 4.5 g/dL (ref 3.8–4.9)
Alkaline Phosphatase: 96 IU/L (ref 44–121)
BUN/Creatinine Ratio: 14 (ref 9–23)
BUN: 12 mg/dL (ref 6–24)
Bilirubin Total: 0.5 mg/dL (ref 0.0–1.2)
CO2: 26 mmol/L (ref 20–29)
Calcium: 9.7 mg/dL (ref 8.7–10.2)
Chloride: 105 mmol/L (ref 96–106)
Creatinine, Ser: 0.84 mg/dL (ref 0.57–1.00)
Globulin, Total: 2.3 g/dL (ref 1.5–4.5)
Glucose: 101 mg/dL — ABNORMAL HIGH (ref 70–99)
Potassium: 5.1 mmol/L (ref 3.5–5.2)
Sodium: 141 mmol/L (ref 134–144)
Total Protein: 6.8 g/dL (ref 6.0–8.5)
eGFR: 81 mL/min/{1.73_m2} (ref >59)

## 2020-12-30 LAB — VITAMIN D 25 HYDROXY (VIT D DEFICIENCY, FRACTURES): Vit D, 25-Hydroxy: 22 ng/mL — ABNORMAL LOW (ref 30.0–100.0)

## 2021-01-20 ENCOUNTER — Encounter: Payer: Self-pay | Admitting: Nurse Practitioner

## 2021-01-21 ENCOUNTER — Other Ambulatory Visit: Payer: Self-pay

## 2021-01-21 ENCOUNTER — Encounter: Payer: Self-pay | Admitting: Nurse Practitioner

## 2021-01-21 ENCOUNTER — Ambulatory Visit: Payer: Managed Care, Other (non HMO) | Admitting: Nurse Practitioner

## 2021-01-21 VITALS — BP 139/72 | HR 64 | Temp 98.1°F | Resp 16 | Ht 64.0 in | Wt 200.0 lb

## 2021-01-21 DIAGNOSIS — K219 Gastro-esophageal reflux disease without esophagitis: Secondary | ICD-10-CM

## 2021-01-21 DIAGNOSIS — I10 Essential (primary) hypertension: Secondary | ICD-10-CM | POA: Diagnosis not present

## 2021-01-21 MED ORDER — AMLODIPINE BESYLATE 2.5 MG PO TABS: 2.5000 mg | ORAL_TABLET | Freq: Every day | ORAL | 1 refills | Status: DC

## 2021-01-21 NOTE — Progress Notes (Signed)
[facility] [address] [place], [place] [address]  Internal MEDICINE  Office Visit Note  Patient Name: Margaret Lynch  [id number]  [id number]  Date of Service: 01/21/2021  Chief Complaint  Patient presents with   Hypertension   Gastroesophageal Reflux   Follow-up    HPI Margaret Lynch presents for a follow up visit for hypertension. She was started on amlodipine 2.5 mg daily at her previous office visit. Her blood pressure is much improved and she denies any adverse side effects from the medication. She has no other concerns or questions.     Current Medication: Outpatient Encounter Medications as of 01/21/2021  Medication Sig   atenolol (TENORMIN) 25 MG tablet TAKE 1 TABLET BY MOUTH AT  BEDTIME   fluticasone (FLONASE) 50 MCG/ACT nasal spray Place 2 sprays into both nostrils daily.   ketoconazole (NIZORAL) 2 % cream Apply 1 application topically daily.   losartan (COZAAR) 100 MG tablet TAKE 1 TABLET BY MOUTH  DAILY   omeprazole (PRILOSEC) 40 MG capsule Take 1 capsule (40 mg total) by mouth daily.   [DISCONTINUED] amLODipine (NORVASC) 2.5 MG tablet Take 1 tablet (2.5 mg total) by mouth daily.   amLODipine (NORVASC) 2.5 MG tablet Take 1 tablet (2.5 mg total) by mouth daily.   No facility-administered encounter medications on file as of 01/21/2021.    Surgical History: Past Surgical History:  Procedure Laterality Date   COLONOSCOPY WITH PROPOFOL     COLONOSCOPY WITH PROPOFOL N/A 01/15/2019   Procedure: COLONOSCOPY WITH PROPOFOL;  Surgeon: Wyline Mood, MD;  Location: [facility] ENDOSCOPY;  Service: Gastroenterology;  Laterality: N/A;    Medical History: Past Medical History:  Diagnosis Date   Acid reflux    HTN (hypertension)    Nocturia    Overweight    Urinary frequency     Family History: Family History  Problem Relation Age of Onset   Hypertension Mother    Diabetes Mother    Esophageal cancer Father    Kidney disease Neg Hx    Prostate cancer Neg Hx     Bladder Cancer Neg Hx    Breast cancer Neg Hx     Social History   Socioeconomic History   Marital status: Single    Spouse name: Not on file   Number of children: Not on file   Years of education: Not on file   Highest education level: Not on file  Occupational History   Not on file  Tobacco Use   Smoking status: Never   Smokeless tobacco: Never  Vaping Use   Vaping Use: Never used  Substance and Sexual Activity   Alcohol use: No    Alcohol/week: 0.0 standard drinks   Drug use: No   Sexual activity: Not on file  Other Topics Concern   Not on file  Social History Narrative   Not on file   Social Determinants of Health   Financial Resource Strain: Not on file  Food Insecurity: Not on file  Transportation Needs: Not on file  Physical Activity: Not on file  Stress: Not on file  Social Connections: Not on file  Intimate Partner Violence: Not on file      Review of Systems  Constitutional:  Negative for chills, fatigue and unexpected weight change.  HENT:  Negative for congestion, rhinorrhea, sneezing and sore throat.   Eyes:  Negative for redness.  Respiratory:  Negative for cough, chest tightness and shortness of breath.   Cardiovascular:  Negative for chest pain and palpitations.  Gastrointestinal:  Negative for abdominal pain, constipation, diarrhea, nausea and vomiting.  Genitourinary:  Negative for dysuria and frequency.  Musculoskeletal:  Negative for arthralgias, back pain, joint swelling and neck pain.  Skin:  Negative for rash.  Neurological: Negative.  Negative for tremors and numbness.  Hematological:  Negative for adenopathy. Does not bruise/bleed easily.  Psychiatric/Behavioral:  Negative for behavioral problems (Depression), sleep disturbance and suicidal ideas. The patient is not nervous/anxious.    Vital Signs: BP 139/72    Pulse 64    Temp 98.1 F (36.7 C)    Resp 16    Ht 5\' 4"  (1.626 m)    Wt 200 lb (90.7 kg)    SpO2 98%    BMI 34.33 kg/m     Physical Exam Vitals reviewed.  Constitutional:      General: She is not in acute distress.    Appearance: Normal appearance. She is obese. She is not ill-appearing.  HENT:     Head: Normocephalic and atraumatic.  Eyes:     Pupils: Pupils are equal, round, and reactive to light.  Cardiovascular:     Rate and Rhythm: Normal rate and regular rhythm.  Pulmonary:     Effort: Pulmonary effort is normal. No respiratory distress.  Neurological:     Mental Status: She is alert and oriented to person, place, and time.     Cranial Nerves: No cranial nerve deficit.     Coordination: Coordination normal.     Gait: Gait normal.  Psychiatric:        Mood and Affect: Mood normal.        Behavior: Behavior normal.       Assessment/Plan: 1. Essential hypertension Continue as prescribed, prescription change from 30 days to 90 days.  - amLODipine (NORVASC) 2.5 MG tablet; Take 1 tablet (2.5 mg total) by mouth daily.  Dispense: 90 tablet; Refill: 1  2. Gastroesophageal reflux disease without esophagitis Stable, continue omeprazole    General Counseling: understanding of the findings of todays visit and agrees with plan of treatment. I have discussed any further diagnostic evaluation that may be needed or ordered today. We also reviewed her medications today. she has been encouraged to call the office with any questions or concerns that should arise related to todays visit.    No orders of the defined types were placed in this encounter.   Meds ordered this encounter  Medications   amLODipine (NORVASC) 2.5 MG tablet    Sig: Take 1 tablet (2.5 mg total) by mouth daily.    Dispense:  90 tablet    Refill:  1    Return in about 3 months (around 04/21/2021) for F/U, med refill, Margaret Lynch PCP.   Total time spent:20 Minutes Time spent includes review of chart, medications, test results, and follow up plan with the patient.   [place] Controlled Substance Database was reviewed  by me.  This patient was seen by 04/23/2021, FNP-C in collaboration with Dr. Sallyanne Kuster as a part of collaborative care agreement.   Jerid Catherman R. Beverely Risen, MSN, FNP-C Internal medicine

## 2021-01-23 ENCOUNTER — Other Ambulatory Visit: Payer: Self-pay | Admitting: Nurse Practitioner

## 2021-01-23 DIAGNOSIS — I10 Essential (primary) hypertension: Secondary | ICD-10-CM

## 2021-01-30 ENCOUNTER — Other Ambulatory Visit: Payer: Self-pay | Admitting: Nurse Practitioner

## 2021-01-30 DIAGNOSIS — Z1231 Encounter for screening mammogram for malignant neoplasm of breast: Secondary | ICD-10-CM

## 2021-03-05 ENCOUNTER — Other Ambulatory Visit: Payer: Self-pay

## 2021-03-05 ENCOUNTER — Ambulatory Visit
Admission: RE | Admit: 2021-03-05 | Discharge: 2021-03-05 | Disposition: A | Discharge status: Home / Self Care | Payer: Managed Care, Other (non HMO) | Source: Ambulatory Visit | Attending: Nurse Practitioner | Admitting: Nurse Practitioner

## 2021-03-05 DIAGNOSIS — Z1231 Encounter for screening mammogram for malignant neoplasm of breast: Secondary | ICD-10-CM | POA: Insufficient documentation

## 2021-04-20 ENCOUNTER — Other Ambulatory Visit: Payer: Self-pay

## 2021-04-20 ENCOUNTER — Ambulatory Visit: Payer: Managed Care, Other (non HMO) | Admitting: Nurse Practitioner

## 2021-04-20 ENCOUNTER — Encounter: Payer: Self-pay | Admitting: Nurse Practitioner

## 2021-04-20 VITALS — BP 138/78 | HR 60 | Temp 98.3°F | Resp 16 | Ht 64.0 in | Wt 201.4 lb

## 2021-04-20 DIAGNOSIS — Z23 Encounter for immunization: Secondary | ICD-10-CM | POA: Diagnosis not present

## 2021-04-20 DIAGNOSIS — B372 Candidiasis of skin and nail: Secondary | ICD-10-CM

## 2021-04-20 DIAGNOSIS — K219 Gastro-esophageal reflux disease without esophagitis: Secondary | ICD-10-CM | POA: Diagnosis not present

## 2021-04-20 DIAGNOSIS — I10 Essential (primary) hypertension: Secondary | ICD-10-CM

## 2021-04-20 MED ORDER — ZOSTER VAC RECOMB ADJUVANTED 50 MCG/0.5ML IM SUSR: 0.5000 mL | Freq: Once | INTRAMUSCULAR | 0 refills | Status: AC

## 2021-04-20 MED ORDER — LOSARTAN POTASSIUM 100 MG PO TABS: 100.0000 mg | ORAL_TABLET | Freq: Every day | ORAL | 3 refills | Status: DC

## 2021-04-20 MED ORDER — KETOCONAZOLE 2 % EX CREA: 1.0000 | TOPICAL_CREAM | Freq: Every day | CUTANEOUS | 5 refills | Status: DC

## 2021-04-20 MED ORDER — ATENOLOL 25 MG PO TABS: 25.0000 mg | ORAL_TABLET | Freq: Every day | ORAL | 3 refills | Status: DC

## 2021-04-20 MED ORDER — OMEPRAZOLE 40 MG PO CPDR: 40.0000 mg | DELAYED_RELEASE_CAPSULE | Freq: Every day | ORAL | 1 refills | Status: DC

## 2021-04-20 MED ORDER — TETANUS-DIPHTH-ACELL PERTUSSIS 5-2.5-18.5 LF-MCG/0.5 IM SUSP: 0.5000 mL | Freq: Once | INTRAMUSCULAR | 0 refills | Status: AC

## 2021-04-20 MED ORDER — AMLODIPINE BESYLATE 2.5 MG PO TABS: 2.5000 mg | ORAL_TABLET | Freq: Every day | ORAL | 1 refills | Status: DC

## 2021-04-20 NOTE — Progress Notes (Signed)
[facility] ?[address] ?[place],  [address] ? ?Internal MEDICINE  ?Office Visit Note ? ?Patient Name: Margaret Lynch ? [id number]  ?[id number] ? ?Date of Service: 04/20/2021 ? ?Chief Complaint  ?Patient presents with  ? Follow-up  ? Medication Refill  ? Hypertension  ? Gastroesophageal Reflux  ? ? ?Medication Refill ?Pertinent negatives include no abdominal pain, arthralgias, chest pain, chills, congestion, coughing, fatigue, joint swelling, nausea, neck pain, numbness, rash, sore throat or vomiting.  ?Hypertension ?Pertinent negatives include no chest pain, neck pain, palpitations or shortness of breath.  ?Gastroesophageal Reflux ?She reports no abdominal pain, no chest pain, no coughing, no nausea or no sore throat. Pertinent negatives include no fatigue.  ?Margaret Lynch presents for follow-up visit for medication refills.  Patient has hypertension and her blood pressure is well controlled with current medications.  She takes omeprazole for gastroesophageal reflux and her symptoms are well controlled.  She has no other questions or concerns today and she denies any pain. ? ? ? ?Current Medication: ?Outpatient Encounter Medications as of 04/20/2021  ?Medication Sig  ? fluticasone (FLONASE) 50 MCG/ACT nasal spray Place 2 sprays into both nostrils daily.  ? [DISCONTINUED] amLODipine (NORVASC) 2.5 MG tablet TAKE 1 TABLET(2.5 MG) BY MOUTH DAILY  ? [DISCONTINUED] atenolol (TENORMIN) 25 MG tablet TAKE 1 TABLET BY MOUTH AT  BEDTIME  ? [DISCONTINUED] ketoconazole (NIZORAL) 2 % cream Apply 1 application topically daily.  ? [DISCONTINUED] losartan (COZAAR) 100 MG tablet TAKE 1 TABLET BY MOUTH  DAILY  ? [DISCONTINUED] omeprazole (PRILOSEC) 40 MG capsule Take 1 capsule (40 mg total) by mouth daily.  ? [DISCONTINUED] Tdap (BOOSTRIX) 5-2.5-18.5 LF-MCG/0.5 injection Inject 0.5 mLs into the muscle once.  ? [DISCONTINUED] Zoster Vaccine Adjuvanted [facility]) injection Inject 0.5 mLs into the muscle once.  ? amLODipine  (NORVASC) 2.5 MG tablet Take 1 tablet (2.5 mg total) by mouth daily.  ? atenolol (TENORMIN) 25 MG tablet Take 1 tablet (25 mg total) by mouth at bedtime.  ? ketoconazole (NIZORAL) 2 % cream Apply 1 application. topically daily.  ? losartan (COZAAR) 100 MG tablet Take 1 tablet (100 mg total) by mouth daily.  ? omeprazole (PRILOSEC) 40 MG capsule Take 1 capsule (40 mg total) by mouth daily.  ? Tdap (BOOSTRIX) 5-2.5-18.5 LF-MCG/0.5 injection Inject 0.5 mLs into the muscle once for 1 dose.  ? Zoster Vaccine Adjuvanted [facility]) injection Inject 0.5 mLs into the muscle once for 1 dose.  ? ?No facility-administered encounter medications on file as of 04/20/2021.  ? ? ?Surgical History: ?Past Surgical History:  ?Procedure Laterality Date  ? COLONOSCOPY WITH PROPOFOL    ? COLONOSCOPY WITH PROPOFOL N/A 01/15/2019  ? Procedure: COLONOSCOPY WITH PROPOFOL;  Surgeon: Jonathon Bellows, MD;  Location: [facility] ENDOSCOPY;  Service: Gastroenterology;  Laterality: N/A;  ? ? ?Medical History: ?Past Medical History:  ?Diagnosis Date  ? Acid reflux   ? HTN (hypertension)   ? Nocturia   ? Overweight   ? Urinary frequency   ? ? ?Family History: ?Family History  ?Problem Relation Age of Onset  ? Hypertension Mother   ? Diabetes Mother   ? Esophageal cancer Father   ? Kidney disease Neg Hx   ? Prostate cancer Neg Hx   ? Bladder Cancer Neg Hx   ? Breast cancer Neg Hx   ? ? ?Social History  ? ?Socioeconomic History  ? Marital status: Single  ?  Spouse name: Not on file  ? Number of children: Not on file  ? Years  of education: Not on file  ? Highest education level: Not on file  ?Occupational History  ? Not on file  ?Tobacco Use  ? Smoking status: Never  ? Smokeless tobacco: Never  ?Vaping Use  ? Vaping Use: Never used  ?Substance and Sexual Activity  ? Alcohol use: No  ?  Alcohol/week: 0.0 standard drinks  ? Drug use: No  ? Sexual activity: Not on file  ?Other Topics Concern  ? Not on file  ?Social History Narrative  ? Not on file  ? ?Social  Determinants of Health  ? ?Financial Resource Strain: Not on file  ?Food Insecurity: Not on file  ?Transportation Needs: Not on file  ?Physical Activity: Not on file  ?Stress: Not on file  ?Social Connections: Not on file  ?Intimate Partner Violence: Not on file  ? ? ? ? ?Review of Systems  ?Constitutional:  Negative for chills, fatigue and unexpected weight change.  ?HENT:  Negative for congestion, rhinorrhea, sneezing and sore throat.   ?Eyes:  Negative for redness.  ?Respiratory:  Negative for cough, chest tightness and shortness of breath.   ?Cardiovascular:  Negative for chest pain and palpitations.  ?Gastrointestinal:  Negative for abdominal pain, constipation, diarrhea, nausea and vomiting.  ?Genitourinary:  Negative for dysuria and frequency.  ?Musculoskeletal:  Negative for arthralgias, back pain, joint swelling and neck pain.  ?Skin:  Negative for rash.  ?Neurological: Negative.  Negative for tremors and numbness.  ?Hematological:  Negative for adenopathy. Does not bruise/bleed easily.  ?Psychiatric/Behavioral:  Negative for behavioral problems (Depression), sleep disturbance and suicidal ideas. The patient is not nervous/anxious.   ? ?Vital Signs: ?BP 138/78   Pulse 60   Temp 98.3 ?F (36.8 ?C)   Resp 16   Ht 5\' 4"  (1.626 m)   Wt 201 lb 6.4 oz (91.4 kg)   SpO2 98%   BMI 34.57 kg/m?  ? ? ?Physical Exam ?Vitals reviewed.  ?Constitutional:   ?   Appearance: Normal appearance. She is obese.  ?HENT:  ?   Head: Normocephalic and atraumatic.  ?Eyes:  ?   Pupils: Pupils are equal, round, and reactive to light.  ?Cardiovascular:  ?   Rate and Rhythm: Normal rate and regular rhythm.  ?Pulmonary:  ?   Effort: Pulmonary effort is normal. No respiratory distress.  ?Neurological:  ?   Mental Status: She is alert and oriented to person, place, and time.  ?   Cranial Nerves: No cranial nerve deficit.  ?   Coordination: Coordination normal.  ?   Gait: Gait normal.  ?Psychiatric:     ?   Mood and Affect: Mood normal.      ?   Behavior: Behavior normal.  ? ? ? ? ? ?Assessment/Plan: ?1. Essential hypertension ?Stable, well controlled. Refills ordered. ?- amLODipine (NORVASC) 2.5 MG tablet; Take 1 tablet (2.5 mg total) by mouth daily.  Dispense: 90 tablet; Refill: 1 ?- atenolol (TENORMIN) 25 MG tablet; Take 1 tablet (25 mg total) by mouth at bedtime.  Dispense: 90 tablet; Refill: 3 ?- losartan (COZAAR) 100 MG tablet; Take 1 tablet (100 mg total) by mouth daily.  Dispense: 90 tablet; Refill: 3 ? ?2. Gastroesophageal reflux disease without esophagitis ?Well controlled, refills ordered ?- omeprazole (PRILOSEC) 40 MG capsule; Take 1 capsule (40 mg total) by mouth daily.  Dispense: 90 capsule; Refill: 1 ? ?3. Cutaneous candidiasis ?Intermittent issue. Refills ordered ?- ketoconazole (NIZORAL) 2 % cream; Apply 1 application. topically daily.  Dispense: 30 g; Refill: 5 ? ?  4. Need for vaccination ?- Zoster Vaccine Adjuvanted [facility]) injection; Inject 0.5 mLs into the muscle once for 1 dose.  Dispense: 0.5 mL; Refill: 0 ?- Tdap (BOOSTRIX) 5-2.5-18.5 LF-MCG/0.5 injection; Inject 0.5 mLs into the muscle once for 1 dose.  Dispense: 0.5 mL; Refill: 0 ? ? ?General Counseling: pearlina werther understanding of the findings of todays visit and agrees with plan of treatment. I have discussed any further diagnostic evaluation that may be needed or ordered today. We also reviewed her medications today. she has been encouraged to call the office with any questions or concerns that should arise related to todays visit. ? ? ? ?No orders of the defined types were placed in this encounter. ? ? ?Meds ordered this encounter  ?Medications  ? Zoster Vaccine Adjuvanted [facility]) injection  ?  Sig: Inject 0.5 mLs into the muscle once for 1 dose.  ?  Dispense:  0.5 mL  ?  Refill:  0  ? Tdap (BOOSTRIX) 5-2.5-18.5 LF-MCG/0.5 injection  ?  Sig: Inject 0.5 mLs into the muscle once for 1 dose.  ?  Dispense:  0.5 mL  ?  Refill:  0  ? amLODipine (NORVASC) 2.5 MG  tablet  ?  Sig: Take 1 tablet (2.5 mg total) by mouth daily.  ?  Dispense:  90 tablet  ?  Refill:  1  ?  **Patient requests 90 days supply**  ? atenolol (TENORMIN) 25 MG tablet  ?  Sig: Take 1 tablet (25 mg total) by m

## 2021-05-04 ENCOUNTER — Telehealth: Payer: Managed Care, Other (non HMO) | Admitting: Internal Medicine

## 2021-05-04 VITALS — Ht 64.0 in | Wt 201.0 lb

## 2021-05-04 DIAGNOSIS — J301 Allergic rhinitis due to pollen: Secondary | ICD-10-CM

## 2021-05-04 DIAGNOSIS — J069 Acute upper respiratory infection, unspecified: Secondary | ICD-10-CM

## 2021-05-04 NOTE — Progress Notes (Signed)
[facility] ?[address] ?[place], [place] [address] ? ?Internal MEDICINE  ?Telephone Visit ? ?Patient Name: Margaret Lynch ? [id number]  ?[id number] ? ?Date of Service: 05/04/2021 ? ?I connected with the patient at 955 by telephone and verified the patients identity using two identifiers.   ?I discussed the limitations, risks, security and privacy concerns of performing an evaluation and management service by telephone and the availability of in person appointments. I also discussed with the patient that there may be a patient responsible charge related to the service.  The patient expressed understanding and agrees to proceed.   ? ?Chief Complaint  ?Patient presents with  ? Telephone Assessment  ?  [id number]  ? Telephone Screen  ?  Waiting for covid test result   ? Cough  ? Sinusitis  ? Headache  ? ? ?HPI ?Pt is connected via virtual visit. ?Pt is having postnasal and cough.  Patient denies any fever and chills she is waiting on her COVID test which she rates her illness at 3 on a scale of 1-10 ? ?She is using Flonase over-the-counter with some relief ?Denies any body aches ?Current Medication: ?Outpatient Encounter Medications as of 05/04/2021  ?Medication Sig  ? amLODipine (NORVASC) 2.5 MG tablet Take 1 tablet (2.5 mg total) by mouth daily.  ? atenolol (TENORMIN) 25 MG tablet Take 1 tablet (25 mg total) by mouth at bedtime.  ? fluticasone (FLONASE) 50 MCG/ACT nasal spray Place 2 sprays into both nostrils daily.  ? ketoconazole (NIZORAL) 2 % cream Apply 1 application. topically daily.  ? losartan (COZAAR) 100 MG tablet Take 1 tablet (100 mg total) by mouth daily.  ? omeprazole (PRILOSEC) 40 MG capsule Take 1 capsule (40 mg total) by mouth daily.  ? ?No facility-administered encounter medications on file as of 05/04/2021.  ? ? ?Surgical History: ?Past Surgical History:  ?Procedure Laterality Date  ? COLONOSCOPY WITH PROPOFOL    ? COLONOSCOPY WITH PROPOFOL N/A 01/15/2019  ? Procedure: COLONOSCOPY WITH  PROPOFOL;  Surgeon: Wyline Mood, MD;  Location: [facility] ENDOSCOPY;  Service: Gastroenterology;  Laterality: N/A;  ? ? ?Medical History: ?Past Medical History:  ?Diagnosis Date  ? Acid reflux   ? HTN (hypertension)   ? Nocturia   ? Overweight   ? Urinary frequency   ? ? ?Family History: ?Family History  ?Problem Relation Age of Onset  ? Hypertension Mother   ? Diabetes Mother   ? Esophageal cancer Father   ? Kidney disease Neg Hx   ? Prostate cancer Neg Hx   ? Bladder Cancer Neg Hx   ? Breast cancer Neg Hx   ? ? ?Social History  ? ?Socioeconomic History  ? Marital status: Single  ?  Spouse name: Not on file  ? Number of children: Not on file  ? Years of education: Not on file  ? Highest education level: Not on file  ?Occupational History  ? Not on file  ?Tobacco Use  ? Smoking status: Never  ? Smokeless tobacco: Never  ?Vaping Use  ? Vaping Use: Never used  ?Substance and Sexual Activity  ? Alcohol use: No  ?  Alcohol/week: 0.0 standard drinks  ? Drug use: No  ? Sexual activity: Not on file  ?Other Topics Concern  ? Not on file  ?Social History Narrative  ? Not on file  ? ?Social Determinants of Health  ? ?Financial Resource Strain: Not on file  ?Food Insecurity: Not on file  ?Transportation Needs: Not on file  ?  Physical Activity: Not on file  ?Stress: Not on file  ?Social Connections: Not on file  ?Intimate Partner Violence: Not on file  ? ? ? ? ?Review of Systems  ?Constitutional:  Negative for fatigue and fever.  ?HENT:  Positive for congestion and postnasal drip. Negative for mouth sores.   ?Respiratory:  Positive for cough.   ?Cardiovascular:  Negative for chest pain.  ?Genitourinary:  Negative for flank pain.  ?Psychiatric/Behavioral: Negative.    ? ?Vital Signs: ?Ht 5\' 4"  (1.626 m)   Wt 201 lb (91.2 kg)   BMI 34.50 kg/m?  ? ? ?Observation/Objective: ? ?Patient sounds somewhat congested but otherwise able to communicate ? ? ?Assessment/Plan: ?1. Seasonal allergic rhinitis due to pollen ?Patient is instructed to  continue take her Flonase add Claritin or Zyrtec over-the-counter ? ?2. Viral upper respiratory tract infection ?Patient is also instructed to try Mucinex over-the-counter for cough increase fluid intake and rest states she will also take Tylenol for headaches. ?Await COVID testing to treat her with any antiviral therapy ? ?General Counseling: darby fleeman understanding of the findings of today's phone visit and agrees with plan of treatment. I have discussed any further diagnostic evaluation that may be needed or ordered today. We also reviewed her medications today. she has been encouraged to call the office with any questions or concerns that should arise related to todays visit. ? ? ? ?No orders of the defined types were placed in this encounter. ? ? ?No orders of the defined types were placed in this encounter. ? ? ?Time spent:10 Minutes ? ? ? ?Dr [name] ?Internal medicine  ?

## 2021-05-05 ENCOUNTER — Telehealth: Payer: Self-pay

## 2021-05-05 MED ORDER — LEVOFLOXACIN 500 MG PO TABS: 500.0000 mg | ORAL_TABLET | Freq: Every day | ORAL | 0 refills | Status: DC

## 2021-05-05 NOTE — Telephone Encounter (Signed)
Pt advised that we send levaquin for 7 days  ?

## 2021-05-05 NOTE — Telephone Encounter (Signed)
Spoke with pt as per dr Beverely Risen we send levaquin for 7 days  ?

## 2021-09-11 ENCOUNTER — Telehealth: Payer: Self-pay

## 2021-09-11 ENCOUNTER — Other Ambulatory Visit: Payer: Self-pay

## 2021-09-11 NOTE — Telephone Encounter (Signed)
Spoke with pt that she had appt Monday for left  eye swollen ask how is doing she said its pretty uncomfortable and since last Wednesday advised her go to urgent care and check it out pt will call back to canceled appt if she go to urgent care

## 2021-09-14 ENCOUNTER — Ambulatory Visit: Payer: Managed Care, Other (non HMO) | Admitting: Physician Assistant

## 2021-09-28 ENCOUNTER — Other Ambulatory Visit: Payer: Self-pay | Admitting: Nurse Practitioner

## 2021-09-28 DIAGNOSIS — I10 Essential (primary) hypertension: Secondary | ICD-10-CM

## 2021-12-10 ENCOUNTER — Encounter: Payer: Managed Care, Other (non HMO) | Admitting: Nurse Practitioner

## 2021-12-30 ENCOUNTER — Ambulatory Visit (INDEPENDENT_AMBULATORY_CARE_PROVIDER_SITE_OTHER): Payer: Managed Care, Other (non HMO) | Admitting: Nurse Practitioner

## 2021-12-30 ENCOUNTER — Encounter: Payer: Self-pay | Admitting: Nurse Practitioner

## 2021-12-30 VITALS — BP 138/80 | HR 80 | Temp 97.7°F | Resp 16 | Ht 64.0 in | Wt 207.2 lb

## 2021-12-30 DIAGNOSIS — I10 Essential (primary) hypertension: Secondary | ICD-10-CM | POA: Diagnosis not present

## 2021-12-30 DIAGNOSIS — E559 Vitamin D deficiency, unspecified: Secondary | ICD-10-CM | POA: Diagnosis not present

## 2021-12-30 DIAGNOSIS — Z0001 Encounter for general adult medical examination with abnormal findings: Secondary | ICD-10-CM

## 2021-12-30 DIAGNOSIS — E782 Mixed hyperlipidemia: Secondary | ICD-10-CM | POA: Diagnosis not present

## 2021-12-30 DIAGNOSIS — R300 Dysuria: Secondary | ICD-10-CM

## 2021-12-30 DIAGNOSIS — Z23 Encounter for immunization: Secondary | ICD-10-CM

## 2021-12-30 MED ORDER — TETANUS-DIPHTH-ACELL PERTUSSIS 5-2.5-18.5 LF-MCG/0.5 IM SUSP: 0.5000 mL | Freq: Once | INTRAMUSCULAR | 0 refills | Status: AC

## 2021-12-30 MED ORDER — OMEPRAZOLE 40 MG PO CPDR: 40.0000 mg | DELAYED_RELEASE_CAPSULE | Freq: Every day | ORAL | 1 refills | Status: DC

## 2021-12-30 MED ORDER — ZOSTER VAC RECOMB ADJUVANTED 50 MCG/0.5ML IM SUSR: 0.5000 mL | Freq: Once | INTRAMUSCULAR | 0 refills | Status: AC

## 2021-12-30 NOTE — Progress Notes (Signed)
[facility] [address] [place], [place] [address]  Internal MEDICINE  Office Visit Note  Patient Name: Margaret Lynch  04/07/1965  [id number]  Date of Service: 12/30/2021  Chief Complaint  Patient presents with   Annual Exam   Gastroesophageal Reflux   Hypertension    HPI Margaret Lynch presents for an annual well visit and physical exam.  Well-appearing 56 y.o. female with prediabetes,  Routine CRC screening: done in 2020, due in 2030 Routine mammogram: feb 2023 DEXA scan: Pap smear: due 2027 Eye exam and/or foot exam: Labs:  New or worsening pain: Other concerns: Suave lotion lubriderm   Current Medication: Outpatient Encounter Medications as of 12/30/2021  Medication Sig   amLODipine (NORVASC) 2.5 MG tablet TAKE 1 TABLET BY MOUTH DAILY   atenolol (TENORMIN) 25 MG tablet Take 1 tablet (25 mg total) by mouth at bedtime.   fluticasone (FLONASE) 50 MCG/ACT nasal spray Place 2 sprays into both nostrils daily.   ketoconazole (NIZORAL) 2 % cream Apply 1 application. topically daily.   losartan (COZAAR) 100 MG tablet Take 1 tablet (100 mg total) by mouth daily.   Tdap (BOOSTRIX) 5-2.5-18.5 LF-MCG/0.5 injection Inject 0.5 mLs into the muscle once for 1 dose.   [DISCONTINUED] levofloxacin (LEVAQUIN) 500 MG tablet Take 1 tablet (500 mg total) by mouth daily.   [DISCONTINUED] omeprazole (PRILOSEC) 40 MG capsule Take 1 capsule (40 mg total) by mouth daily.   [DISCONTINUED] Zoster Vaccine Adjuvanted (SHINGRIX) injection Inject 0.5 mLs into the muscle once.   omeprazole (PRILOSEC) 40 MG capsule Take 1 capsule (40 mg total) by mouth daily.   [EXPIRED] Tdap (BOOSTRIX) 5-2.5-18.5 LF-MCG/0.5 injection Inject 0.5 mLs into the muscle once for 1 dose.   [EXPIRED] Zoster Vaccine Adjuvanted (SHINGRIX) injection Inject 0.5 mLs into the muscle once for 1 dose.   Zoster Vaccine Adjuvanted (SHINGRIX) injection Inject 0.5 mLs into the muscle once for 1 dose.   [DISCONTINUED] amLODipine  (NORVASC) 2.5 MG tablet Take 1 tablet (2.5 mg total) by mouth daily.   No facility-administered encounter medications on file as of 12/30/2021.    Surgical History: Past Surgical History:  Procedure Laterality Date   COLONOSCOPY WITH PROPOFOL     COLONOSCOPY WITH PROPOFOL N/A 01/15/2019   Procedure: COLONOSCOPY WITH PROPOFOL;  Surgeon: Anna, Kiran, MD;  Location: [facility] ENDOSCOPY;  Service: Gastroenterology;  Laterality: N/A;    Medical History: Past Medical History:  Diagnosis Date   Acid reflux    HTN (hypertension)    Nocturia    Overweight    Urinary frequency     Family History: Family History  Problem Relation Age of Onset   Hypertension Mother    Diabetes Mother    Esophageal cancer Father    Kidney disease Neg Hx    Prostate cancer Neg Hx    Bladder Cancer Neg Hx    Breast cancer Neg Hx     Social History   Socioeconomic History   Marital status: Single    Spouse name: Not on file   Number of children: Not on file   Years of education: Not on file   Highest education level: Not on file  Occupational History   Not on file  Tobacco Use   Smoking status: Never   Smokeless tobacco: Never  Vaping Use   Vaping Use: Never used  Substance and Sexual Activity   Alcohol use: No    Alcohol/week: 0.0 standard drinks of alcohol   Drug use: No   Sexual activity: Not   on file  Other Topics Concern   Not on file  Social History Narrative   Not on file   Social Determinants of Health   Financial Resource Strain: Not on file  Food Insecurity: Not on file  Transportation Needs: Not on file  Physical Activity: Not on file  Stress: Not on file  Social Connections: Not on file  Intimate Partner Violence: Not on file      Review of Systems  Constitutional:  Negative for activity change, appetite change, chills, fatigue, fever and unexpected weight change.  HENT: Negative.  Negative for congestion, ear pain, rhinorrhea, sore throat and trouble swallowing.    Eyes: Negative.   Respiratory: Negative.  Negative for cough, chest tightness, shortness of breath and wheezing.   Cardiovascular: Negative.  Negative for chest pain.  Gastrointestinal: Negative.  Negative for abdominal pain, blood in stool, constipation, diarrhea, nausea and vomiting.  Endocrine: Negative.   Genitourinary: Negative.  Negative for difficulty urinating, dysuria, frequency, hematuria and urgency.  Musculoskeletal: Negative.  Negative for arthralgias, back pain, joint swelling, myalgias and neck pain.  Skin: Negative.  Negative for rash and wound.  Allergic/Immunologic: Negative.  Negative for immunocompromised state.  Neurological: Negative.  Negative for dizziness, seizures, numbness and headaches.  Hematological: Negative.   Psychiatric/Behavioral: Negative.  Negative for behavioral problems, self-injury and suicidal ideas. The patient is not nervous/anxious.     Vital Signs: BP 138/80 Comment: 173/87  Pulse 80   Temp 97.7 F (36.5 C)   Resp 16   Ht 5' 4" (1.626 m)   Wt 207 lb 3.2 oz (94 kg)   SpO2 98%   BMI 35.57 kg/m    Physical Exam Vitals reviewed.  Constitutional:      General: She is not in acute distress.    Appearance: She is well-developed. She is not diaphoretic.  HENT:     Head: Normocephalic and atraumatic.     Right Ear: External ear normal.     Left Ear: External ear normal.     Nose: Nose normal.     Mouth/Throat:     Pharynx: No oropharyngeal exudate.  Eyes:     General: No scleral icterus.       Right eye: No discharge.        Left eye: No discharge.     Conjunctiva/sclera: Conjunctivae normal.     Pupils: Pupils are equal, round, and reactive to light.  Neck:     Thyroid: No thyromegaly.     Vascular: No JVD.     Trachea: No tracheal deviation.  Cardiovascular:     Rate and Rhythm: Normal rate and regular rhythm.     Heart sounds: Normal heart sounds. No murmur heard.    No friction rub. No gallop.  Pulmonary:     Effort:  Pulmonary effort is normal. No respiratory distress.     Breath sounds: Normal breath sounds. No stridor. No wheezing or rales.  Chest:     Chest wall: No tenderness.  Abdominal:     General: Bowel sounds are normal. There is no distension.     Palpations: Abdomen is soft. There is no mass.     Tenderness: There is no abdominal tenderness. There is no guarding or rebound.  Musculoskeletal:        General: No tenderness or deformity. Normal range of motion.     Cervical back: Normal range of motion and neck supple.  Lymphadenopathy:     Cervical: No cervical adenopathy.  Skin:  General: Skin is warm and dry.     Coloration: Skin is not pale.     Findings: No erythema or rash.  Neurological:     Mental Status: She is alert.     Cranial Nerves: No cranial nerve deficit.     Motor: No abnormal muscle tone.     Coordination: Coordination normal.     Deep Tendon Reflexes: Reflexes are normal and symmetric.  Psychiatric:        Behavior: Behavior normal.        Thought Content: Thought content normal.        Judgment: Judgment normal.       Assessment/Plan: 1. Encounter for general adult medical examination with abnormal findings Age-appropriate preventive screenings and vaccinations discussed, annual physical exam completed. Routine labs for health maintenance ordered, see below. PHM updated.  - CBC with Differential/Platelet - CMP14+EGFR - Lipid Profile - Vitamin D (25 hydroxy) - omeprazole (PRILOSEC) 40 MG capsule; Take 1 capsule (40 mg total) by mouth daily.  Dispense: 90 capsule; Refill: 1  2. Essential hypertension Routine lab ordered - CBC with Differential/Platelet  3. Mixed hyperlipidemia Routine labs ordered - CMP14+EGFR - Lipid Profile  4. Vitamin D deficiency Routine lab ordered - Vitamin D (25 hydroxy)  5. Dysuria Routine urinalysis done  - UA/M w/rflx Culture, Routine  6. Need for vaccination - Zoster Vaccine Adjuvanted [facility]) injection; Inject  0.5 mLs into the muscle once for 1 dose.  Dispense: 0.5 mL; Refill: 0 - Tdap (BOOSTRIX) 5-2.5-18.5 LF-MCG/0.5 injection; Inject 0.5 mLs into the muscle once for 1 dose.  Dispense: 0.5 mL; Refill: 0      General Counseling: Brittaney verbalizes understanding of the findings of todays visit and agrees with plan of treatment. I have discussed any further diagnostic evaluation that may be needed or ordered today. We also reviewed her medications today. she has been encouraged to call the office with any questions or concerns that should arise related to todays visit.    Orders Placed This Encounter  Procedures   CBC with Differential/Platelet   CMP14+EGFR   Lipid Profile   Vitamin D (25 hydroxy)    Meds ordered this encounter  Medications   Zoster Vaccine Adjuvanted [facility]) injection    Sig: Inject 0.5 mLs into the muscle once for 1 dose.    Dispense:  0.5 mL    Refill:  0   Tdap (BOOSTRIX) 5-2.5-18.5 LF-MCG/0.5 injection    Sig: Inject 0.5 mLs into the muscle once for 1 dose.    Dispense:  0.5 mL    Refill:  0   omeprazole (PRILOSEC) 40 MG capsule    Sig: Take 1 capsule (40 mg total) by mouth daily.    Dispense:  90 capsule    Refill:  1    For future refills    Return in about 6 months (around 06/30/2022) for F/U, med refill, Hanah Moultry PCP.   Total time spent:30 Minutes Time spent includes review of chart, medications, test results, and follow up plan with the patient.   [place] Controlled Substance Database was reviewed by me.  This patient was seen by Jonetta Osgood, FNP-C in collaboration with Dr. Clayborn Bigness as a part of collaborative care agreement.  Jaesean Litzau R. Valetta Fuller, MSN, FNP-C Internal medicine

## 2022-01-02 LAB — MICROSCOPIC EXAMINATION
Bacteria, UA: NONE SEEN
Casts: NONE SEEN /lpf
WBC, UA: >30 /hpf — AB (ref 0–5)

## 2022-01-02 LAB — UA/M W/RFLX CULTURE, ROUTINE
Bilirubin, UA: NEGATIVE
Glucose, UA: NEGATIVE
Ketones, UA: NEGATIVE
Nitrite, UA: NEGATIVE
Protein,UA: NEGATIVE
RBC, UA: NEGATIVE
Specific Gravity, UA: 1.011 (ref 1.005–1.030)
Urobilinogen, Ur: 0.2 mg/dL (ref 0.2–1.0)
pH, UA: 6.5 (ref 5.0–7.5)

## 2022-01-02 LAB — URINE CULTURE, REFLEX

## 2022-01-13 LAB — CBC WITH DIFFERENTIAL/PLATELET
Basophils Absolute: 0.1 10*3/uL (ref 0.0–0.2)
Basos: 1 %
EOS (ABSOLUTE): 0.2 10*3/uL (ref 0.0–0.4)
Eos: 3 %
Hematocrit: 42 % (ref 34.0–46.6)
Hemoglobin: 14 g/dL (ref 11.1–15.9)
Immature Grans (Abs): 0 10*3/uL (ref 0.0–0.1)
Immature Granulocytes: 0 %
Lymphocytes Absolute: 1.4 10*3/uL (ref 0.7–3.1)
Lymphs: 25 %
MCH: 30.8 pg (ref 26.6–33.0)
MCHC: 33.3 g/dL (ref 31.5–35.7)
MCV: 93 fL (ref 79–97)
Monocytes Absolute: 0.6 10*3/uL (ref 0.1–0.9)
Monocytes: 11 %
Neutrophils Absolute: 3.3 10*3/uL (ref 1.4–7.0)
Neutrophils: 60 %
Platelets: 204 10*3/uL (ref 150–450)
RBC: 4.54 x10E6/uL (ref 3.77–5.28)
RDW: 12.1 % (ref 11.7–15.4)
WBC: 5.5 10*3/uL (ref 3.4–10.8)

## 2022-01-13 LAB — CMP14+EGFR
ALT: 18 IU/L (ref 0–32)
AST: 16 IU/L (ref 0–40)
Albumin/Globulin Ratio: 1.8 (ref 1.2–2.2)
Albumin: 4.4 g/dL (ref 3.8–4.9)
Alkaline Phosphatase: 89 IU/L (ref 44–121)
BUN/Creatinine Ratio: 15 (ref 9–23)
BUN: 13 mg/dL (ref 6–24)
Bilirubin Total: 0.5 mg/dL (ref 0.0–1.2)
CO2: 24 mmol/L (ref 20–29)
Calcium: 9.6 mg/dL (ref 8.7–10.2)
Chloride: 103 mmol/L (ref 96–106)
Creatinine, Ser: 0.85 mg/dL (ref 0.57–1.00)
Globulin, Total: 2.4 g/dL (ref 1.5–4.5)
Glucose: 98 mg/dL (ref 70–99)
Potassium: 4.8 mmol/L (ref 3.5–5.2)
Sodium: 140 mmol/L (ref 134–144)
Total Protein: 6.8 g/dL (ref 6.0–8.5)
eGFR: 79 mL/min/{1.73_m2} (ref >59)

## 2022-01-13 LAB — LIPID PANEL
Chol/HDL Ratio: 2.8 ratio (ref 0.0–4.4)
Cholesterol, Total: 124 mg/dL (ref 100–199)
HDL: 45 mg/dL (ref >39)
LDL Chol Calc (NIH): 68 mg/dL (ref 0–99)
Triglycerides: 48 mg/dL (ref 0–149)
VLDL Cholesterol Cal: 11 mg/dL (ref 5–40)

## 2022-01-13 LAB — VITAMIN D 25 HYDROXY (VIT D DEFICIENCY, FRACTURES): Vit D, 25-Hydroxy: 20.3 ng/mL — ABNORMAL LOW (ref 30.0–100.0)

## 2022-02-07 ENCOUNTER — Other Ambulatory Visit: Payer: Self-pay | Admitting: Nurse Practitioner

## 2022-02-07 DIAGNOSIS — Z0001 Encounter for general adult medical examination with abnormal findings: Secondary | ICD-10-CM

## 2022-02-08 ENCOUNTER — Other Ambulatory Visit: Payer: Self-pay | Admitting: Nurse Practitioner

## 2022-02-08 DIAGNOSIS — Z1231 Encounter for screening mammogram for malignant neoplasm of breast: Secondary | ICD-10-CM

## 2022-02-10 ENCOUNTER — Encounter: Payer: Self-pay | Admitting: Nurse Practitioner

## 2022-02-10 ENCOUNTER — Telehealth: Payer: Managed Care, Other (non HMO) | Admitting: Nurse Practitioner

## 2022-02-10 ENCOUNTER — Telehealth: Payer: Self-pay

## 2022-02-10 VITALS — Ht 64.0 in | Wt 200.0 lb

## 2022-02-10 DIAGNOSIS — R051 Acute cough: Secondary | ICD-10-CM | POA: Diagnosis not present

## 2022-02-10 DIAGNOSIS — J0110 Acute frontal sinusitis, unspecified: Secondary | ICD-10-CM | POA: Diagnosis not present

## 2022-02-10 MED ORDER — AMOXICILLIN-POT CLAVULANATE 875-125 MG PO TABS: 1.0000 | ORAL_TABLET | Freq: Two times a day (BID) | ORAL | 0 refills | Status: AC

## 2022-02-10 MED ORDER — BENZONATATE 100 MG PO CAPS: 100.0000 mg | ORAL_CAPSULE | Freq: Two times a day (BID) | ORAL | 0 refills | Status: DC | PRN

## 2022-02-10 NOTE — Telephone Encounter (Signed)
Pt called that her covid test is negative and relayed message to [facility]

## 2022-02-10 NOTE — Progress Notes (Signed)
[facility] [place], [place] [address]  Internal MEDICINE  Telephone Visit  Patient Name: Margaret Lynch  [id number]  [id number]  Date of Service: 02/10/2022  I connected with the patient at 0850 by telephone and verified the patients identity using two identifiers.   I discussed the limitations, risks, security and privacy concerns of performing an evaluation and management service by telephone and the availability of in person appointments. I also discussed with the patient that there may be a patient responsible charge related to the service.  The patient expressed understanding and agrees to proceed.    Chief Complaint  Patient presents with   Telephone Assessment   Telephone Screen   Sinusitis    Going on 5 days    Cough   Headache    HPI Margaret Lynch presents for a telehealth virtual visit for symptoms of sinusitis Onset was 5 days ago Reports cough and headache, runny nose, sinus pressure, sore throat, fatigue and body aches.     Current Medication: Outpatient Encounter Medications as of 02/10/2022  Medication Sig   amLODipine (NORVASC) 2.5 MG tablet TAKE 1 TABLET BY MOUTH DAILY   amoxicillin-clavulanate (AUGMENTIN) 875-125 MG tablet Take 1 tablet by mouth 2 (two) times daily for 10 days. Take with food   atenolol (TENORMIN) 25 MG tablet Take 1 tablet (25 mg total) by mouth at bedtime.   benzonatate (TESSALON) 100 MG capsule Take 1 capsule (100 mg total) by mouth 2 (two) times daily as needed.   fluticasone (FLONASE) 50 MCG/ACT nasal spray Place 2 sprays into both nostrils daily.   ketoconazole (NIZORAL) 2 % cream Apply 1 application. topically daily.   losartan (COZAAR) 100 MG tablet Take 1 tablet (100 mg total) by mouth daily.   omeprazole (PRILOSEC) 40 MG capsule TAKE 1 CAPSULE BY MOUTH DAILY   No facility-administered encounter medications on file as of 02/10/2022.    Surgical History: Past Surgical History:  Procedure Laterality Date    COLONOSCOPY WITH PROPOFOL     COLONOSCOPY WITH PROPOFOL N/A 01/15/2019   Procedure: COLONOSCOPY WITH PROPOFOL;  Surgeon: Jonathon Bellows, MD;  Location: [facility] ENDOSCOPY;  Service: Gastroenterology;  Laterality: N/A;    Medical History: Past Medical History:  Diagnosis Date   Acid reflux    HTN (hypertension)    Nocturia    Overweight    Urinary frequency     Family History: Family History  Problem Relation Age of Onset   Hypertension Mother    Diabetes Mother    Esophageal cancer Father    Kidney disease Neg Hx    Prostate cancer Neg Hx    Bladder Cancer Neg Hx    Breast cancer Neg Hx     Social History   Socioeconomic History   Marital status: Single    Spouse name: Not on file   Number of children: Not on file   Years of education: Not on file   Highest education level: Not on file  Occupational History   Not on file  Tobacco Use   Smoking status: Never   Smokeless tobacco: Never  Vaping Use   Vaping Use: Never used  Substance and Sexual Activity   Alcohol use: No    Alcohol/week: 0.0 standard drinks of alcohol   Drug use: No   Sexual activity: Not on file  Other Topics Concern   Not on file  Social History Narrative   Not on file   Social Determinants of Health   Financial Resource  Strain: Not on file  Food Insecurity: Not on file  Transportation Needs: Not on file  Physical Activity: Not on file  Stress: Not on file  Social Connections: Not on file  Intimate Partner Violence: Not on file      Review of Systems  Constitutional:  Positive for chills and fatigue. Negative for fever.  HENT:  Positive for congestion, postnasal drip, rhinorrhea, sinus pressure, sinus pain and sore throat. Negative for ear pain.   Respiratory:  Positive for cough. Negative for chest tightness, shortness of breath and wheezing.   Cardiovascular:  Negative for chest pain and palpitations.  Gastrointestinal: Negative.   Musculoskeletal:  Positive for myalgias.   Neurological:  Positive for headaches.    Vital Signs: Ht 5\' 4"  (1.626 m)   Wt 200 lb (90.7 kg)   BMI 34.33 kg/m    Observation/Objective: She is alert and oriented and engages in conversation appropriately, no acute distress noted.     Assessment/Plan: 1. Acute non-recurrent frontal sinusitis Empiric antibiotic treatment prescribed.  - amoxicillin-clavulanate (AUGMENTIN) 875-125 MG tablet; Take 1 tablet by mouth 2 (two) times daily for 10 days. Take with food  Dispense: 20 tablet; Refill: 0 - benzonatate (TESSALON) 100 MG capsule; Take 1 capsule (100 mg total) by mouth 2 (two) times daily as needed.  Dispense: 30 capsule; Refill: 0  2. Acute cough Medication prescribed for symptomatic relief of cough - benzonatate (TESSALON) 100 MG capsule; Take 1 capsule (100 mg total) by mouth 2 (two) times daily as needed.  Dispense: 30 capsule; Refill: 0   General Counseling: Margaret Lynch understanding of the findings of today's phone visit and agrees with plan of treatment. I have discussed any further diagnostic evaluation that may be needed or ordered today. We also reviewed her medications today. she has been encouraged to call the office with any questions or concerns that should arise related to todays visit.  Return if symptoms worsen or fail to improve, for will call back with covid test results.   No orders of the defined types were placed in this encounter.   Meds ordered this encounter  Medications   amoxicillin-clavulanate (AUGMENTIN) 875-125 MG tablet    Sig: Take 1 tablet by mouth 2 (two) times daily for 10 days. Take with food    Dispense:  20 tablet    Refill:  0   benzonatate (TESSALON) 100 MG capsule    Sig: Take 1 capsule (100 mg total) by mouth 2 (two) times daily as needed.    Dispense:  30 capsule    Refill:  0    Time spent:10 Minutes Time spent with patient included reviewing progress notes, labs, imaging studies, and discussing plan for follow up.   [place] Controlled Substance Database was reviewed by me for overdose risk score (ORS) if appropriate.  This patient was seen by Jonetta Osgood, FNP-C in collaboration with Dr. Clayborn Bigness as a part of collaborative care agreement.  Toya Palacios R. Valetta Fuller, MSN, FNP-C Internal medicine

## 2022-02-26 ENCOUNTER — Encounter: Payer: Self-pay | Admitting: Physician Assistant

## 2022-02-26 ENCOUNTER — Ambulatory Visit: Payer: Managed Care, Other (non HMO) | Admitting: Physician Assistant

## 2022-02-26 VITALS — BP 142/86 | HR 64 | Temp 98.2°F | Resp 16 | Ht 64.0 in | Wt 206.6 lb

## 2022-02-26 DIAGNOSIS — N898 Other specified noninflammatory disorders of vagina: Secondary | ICD-10-CM

## 2022-02-26 MED ORDER — CLOTRIMAZOLE 1 % VA CREA: 1.0000 | TOPICAL_CREAM | Freq: Every day | VAGINAL | 0 refills | Status: DC

## 2022-02-26 MED ORDER — FLUCONAZOLE 150 MG PO TABS: 150.0000 mg | ORAL_TABLET | Freq: Once | ORAL | 0 refills | Status: AC

## 2022-02-26 NOTE — Progress Notes (Signed)
[facility] [place], [place] [address]  Internal MEDICINE  Office Visit Note  Patient Name: Margaret Lynch  [id number]  [id number]  Date of Service: 02/26/2022  Chief Complaint  Patient presents with   Vaginal Pain     HPI Pt is here for a sick visit. -vaginal irritation and discharge for about a week. Not very itchy currently. Denies recent sex or concerns of STD.  -Painful with wiping, no pain with BM -Some blood on tissue and spotting in underwear from the skin irritation. No blood in the stool or in urine -No change to soaps or washes.  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 Does reports some readings 097D systolic at home and will monitor. May need to increase amlodipine to 5mg  by doubling current dose and will call if staying high. -was recently treated with antibiotic for sinus infection  Current Medication:  Outpatient Encounter Medications as of 02/26/2022  Medication Sig   amLODipine (NORVASC) 2.5 MG tablet TAKE 1 TABLET BY MOUTH DAILY   atenolol (TENORMIN) 25 MG tablet Take 1 tablet (25 mg total) by mouth at bedtime.   benzonatate (TESSALON) 100 MG capsule Take 1 capsule (100 mg total) by mouth 2 (two) times daily as needed.   clotrimazole (GYNE-LOTRIMIN) 1 % vaginal cream Place 1 Applicatorful vaginally at bedtime.   fluconazole (DIFLUCAN) 150 MG tablet Take 1 tablet (150 mg total) by mouth once for 1 dose.   fluticasone (FLONASE) 50 MCG/ACT nasal spray Place 2 sprays into both nostrils daily.   ketoconazole (NIZORAL) 2 % cream Apply 1 application. topically daily.   losartan (COZAAR) 100 MG tablet Take 1 tablet (100 mg total) by mouth daily.   omeprazole (PRILOSEC) 40 MG capsule TAKE 1 CAPSULE BY MOUTH DAILY   No facility-administered encounter medications on file as of 02/26/2022.      Medical History: Past Medical History:  Diagnosis Date   Acid reflux    HTN  (hypertension)    Nocturia    Overweight    Urinary frequency      Vital Signs: BP (!) 142/86 Comment: 142/102  Pulse 64   Temp 98.2 F (36.8 C)   Resp 16   Ht 5\' 4"  (1.626 m)   Wt 206 lb 9.6 oz (93.7 kg)   SpO2 97%   BMI 35.46 kg/m    Review of Systems  Constitutional:  Negative for fatigue and fever.  HENT:  Negative for congestion, mouth sores and postnasal drip.   Respiratory:  Negative for cough.   Cardiovascular:  Negative for chest pain.  Genitourinary:  Positive for vaginal discharge and vaginal pain. Negative for dysuria, flank pain and pelvic pain.       Vaginal skin irritation  Psychiatric/Behavioral: Negative.      Physical Exam Vitals and nursing note reviewed.  Constitutional:      Appearance: Normal appearance.  HENT:     Head: Normocephalic and atraumatic.  Eyes:     Pupils: Pupils are equal, round, and reactive to light.  Cardiovascular:     Rate and Rhythm: Normal rate and regular rhythm.     Pulses: Normal pulses.     Heart sounds: Normal heart sounds.  Skin:    General: Skin is warm and dry.  Neurological:     General: No focal deficit present.     Mental Status: She is alert and oriented to person, place, and  time.  Psychiatric:        Mood and Affect: Mood normal.        Behavior: Behavior normal.       Assessment/Plan: 1. Vaginal irritation Will treat with diflucan and may use topical cream to help with irritation. Pt advised to call if not improving. - fluconazole (DIFLUCAN) 150 MG tablet; Take 1 tablet (150 mg total) by mouth once for 1 dose.  Dispense: 1 tablet; Refill: 0 - clotrimazole (GYNE-LOTRIMIN) 1 % vaginal cream; Place 1 Applicatorful vaginally at bedtime.  Dispense: 45 g; Refill: 0   General Counseling: Graceann verbalizes understanding of the findings of todays visit and agrees with plan of treatment. I have discussed any further diagnostic evaluation that may be needed or ordered today. We also reviewed her medications  today. she has been encouraged to call the office with any questions or concerns that should arise related to todays visit.    Counseling:    No orders of the defined types were placed in this encounter.   Meds ordered this encounter  Medications   fluconazole (DIFLUCAN) 150 MG tablet    Sig: Take 1 tablet (150 mg total) by mouth once for 1 dose.    Dispense:  1 tablet    Refill:  0   clotrimazole (GYNE-LOTRIMIN) 1 % vaginal cream    Sig: Place 1 Applicatorful vaginally at bedtime.    Dispense:  45 g    Refill:  0    Time spent:30 Minutes

## 2022-03-08 ENCOUNTER — Ambulatory Visit
Admission: RE | Admit: 2022-03-08 | Discharge: 2022-03-08 | Disposition: A | Discharge status: Home / Self Care | Payer: Managed Care, Other (non HMO) | Source: Ambulatory Visit | Attending: Nurse Practitioner | Admitting: Nurse Practitioner

## 2022-03-08 DIAGNOSIS — Z1231 Encounter for screening mammogram for malignant neoplasm of breast: Secondary | ICD-10-CM | POA: Insufficient documentation

## 2022-03-25 ENCOUNTER — Other Ambulatory Visit: Payer: Self-pay | Admitting: Nurse Practitioner

## 2022-03-25 DIAGNOSIS — I10 Essential (primary) hypertension: Secondary | ICD-10-CM

## 2022-04-19 ENCOUNTER — Other Ambulatory Visit: Payer: Self-pay | Admitting: Nurse Practitioner

## 2022-04-19 DIAGNOSIS — I10 Essential (primary) hypertension: Secondary | ICD-10-CM

## 2022-04-22 IMAGING — MG MM DIGITAL SCREENING BILAT W/ TOMO AND CAD
8 of 15 series · 8 of 40 positions shown · non-contrast
Comparison: Previous exam(s).

CLINICAL DATA: Screening.

EXAM:
DIGITAL SCREENING BILATERAL MAMMOGRAM WITH TOMOSYNTHESIS AND CAD
TECHNIQUE: Bilateral screening digital craniocaudal and mediolateral oblique
mammograms were obtained. Bilateral screening digital breast
tomosynthesis was performed. The images were evaluated with
computer-aided detection.

[R MLO synth-2D (1 of 2)]
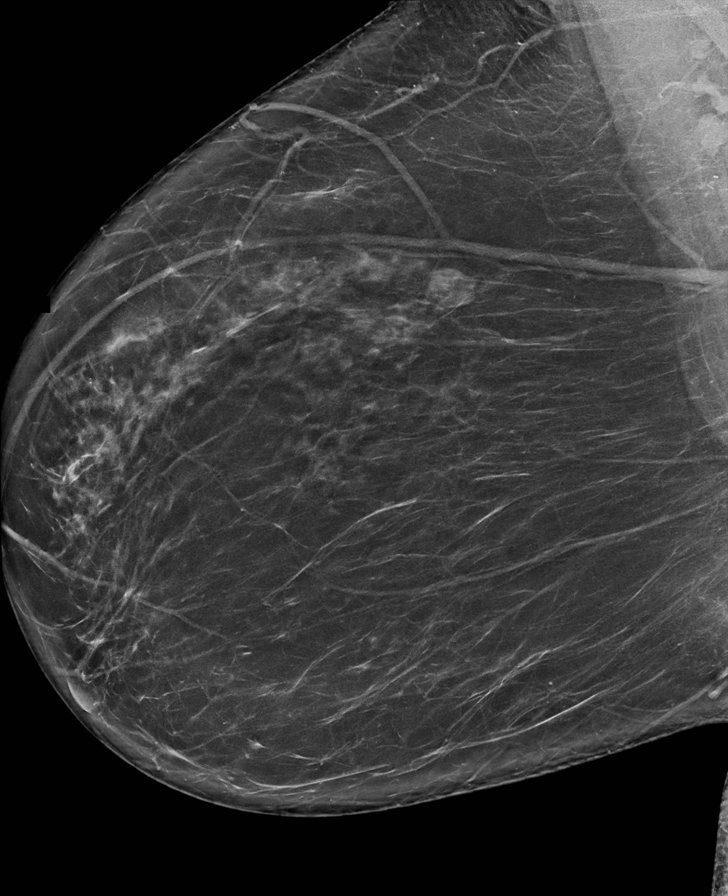

[R XCCL synth-2D]
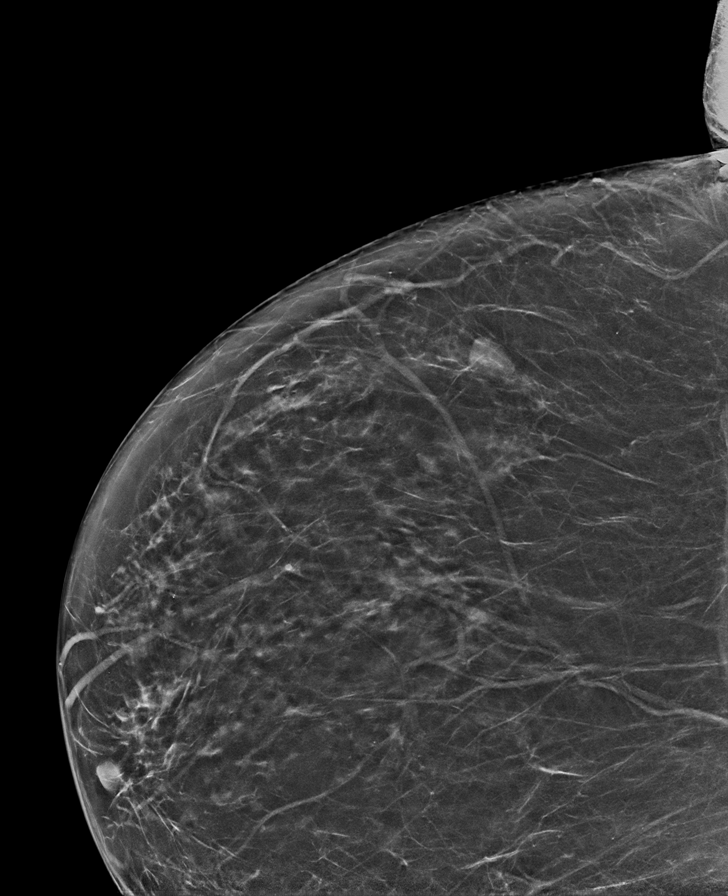

[L XCCL synth-2D]
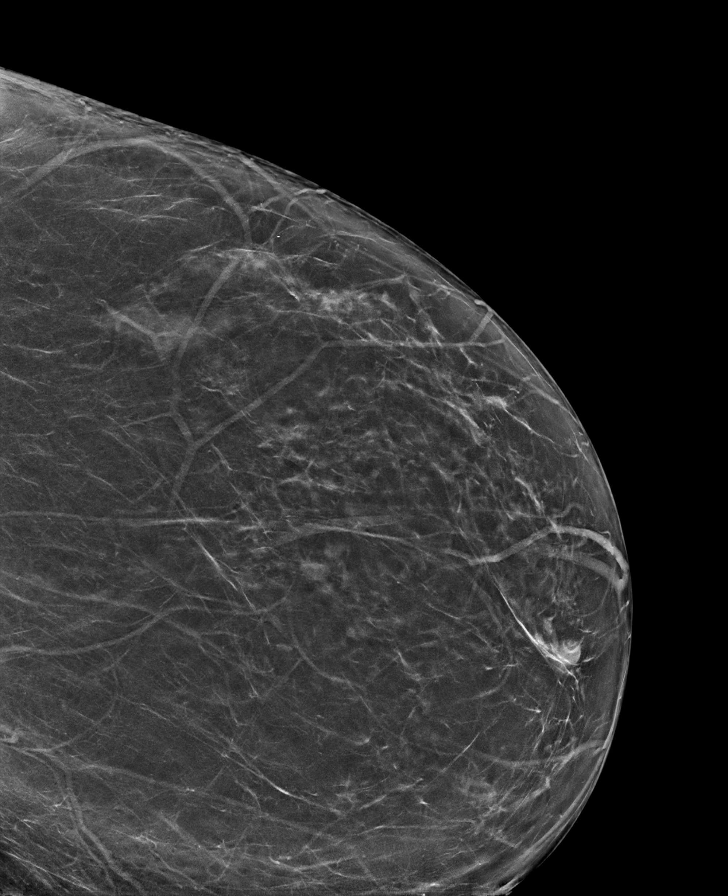

[L MLO synth-2D (1 of 2)]
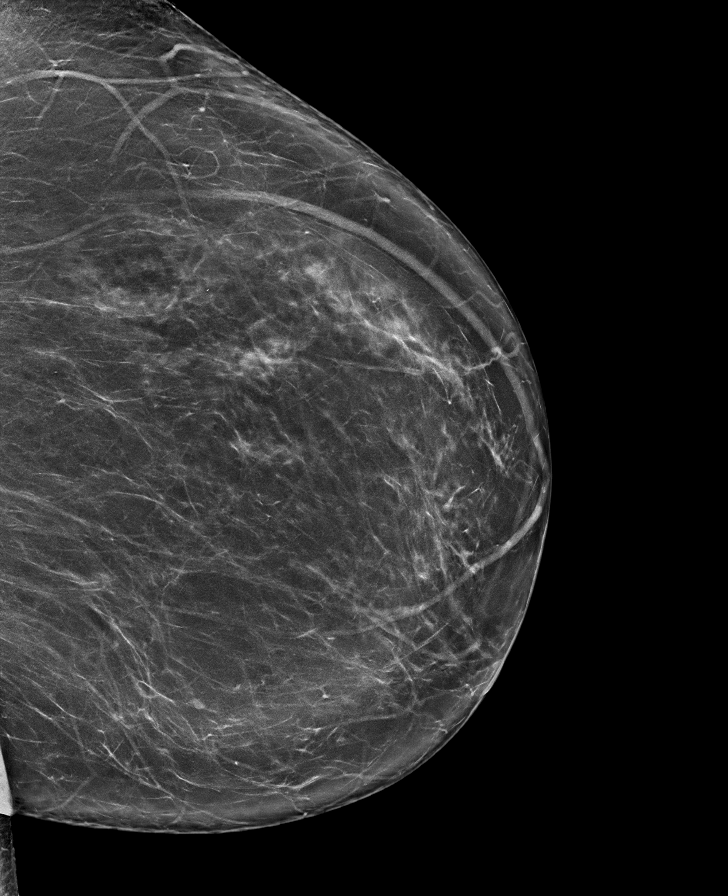

[R MLO synth-2D (2 of 2)]
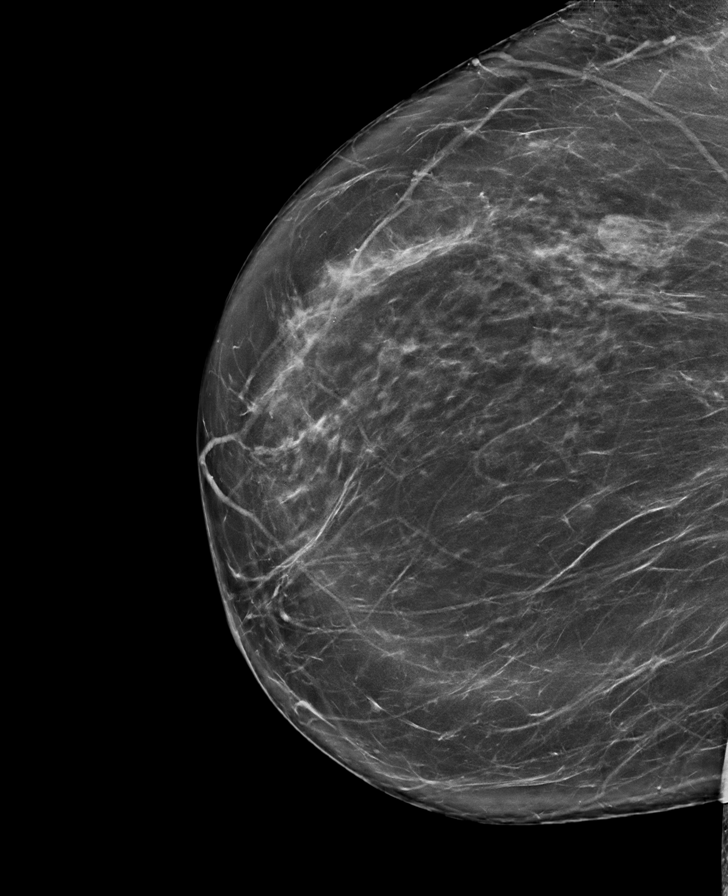

[R CC synth-2D]
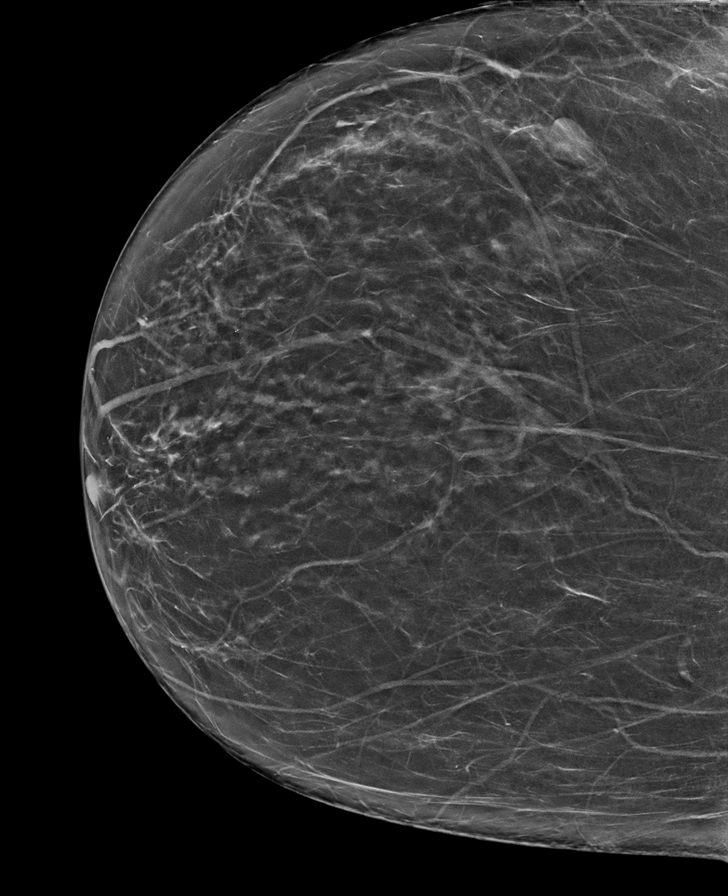

[L MLO synth-2D (2 of 2)]
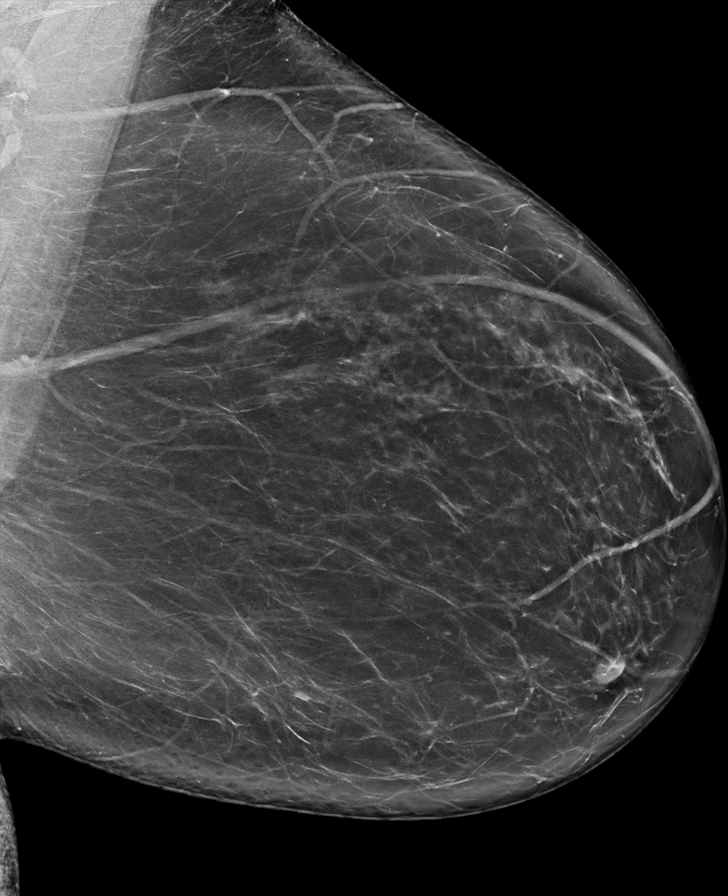

[R MLO tomo · tomo slice 63/92.0]
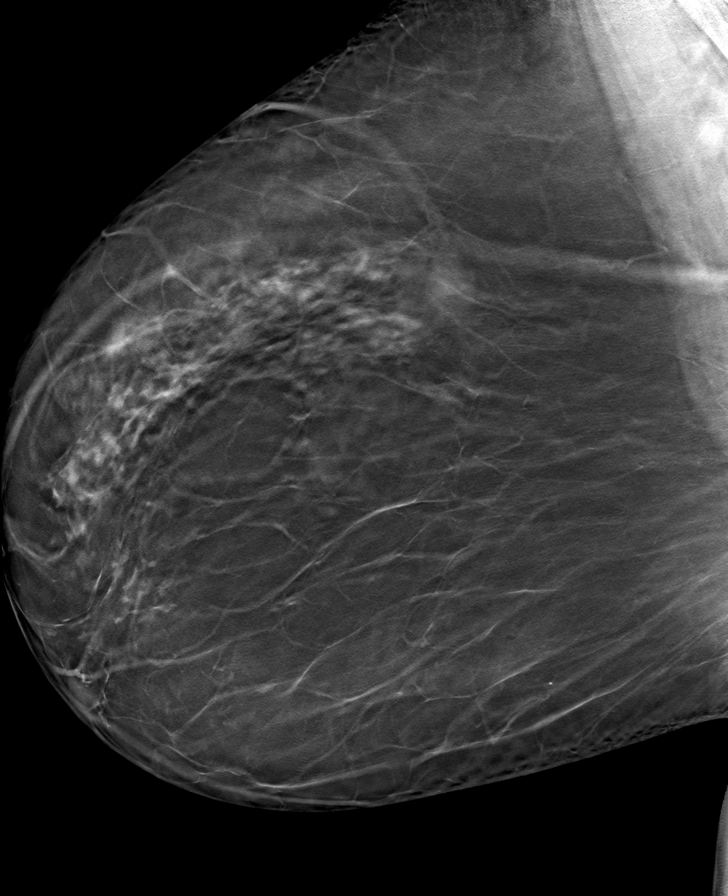

[8 of 40 positions shown; findings below may reference images not displayed]

ACR Breast Density Category b: There are scattered areas of
fibroglandular density.
FINDINGS: There are no findings suspicious for malignancy.
IMPRESSION: No mammographic evidence of malignancy. A result letter of this
screening mammogram will be mailed directly to the patient.

RECOMMENDATION:
Screening mammogram in one year. (Code:51-O-LD2)

BI-RADS CATEGORY  1: Negative.

## 2022-06-25 ENCOUNTER — Ambulatory Visit: Payer: Managed Care, Other (non HMO) | Admitting: Nurse Practitioner

## 2022-06-25 ENCOUNTER — Encounter: Payer: Self-pay | Admitting: Nurse Practitioner

## 2022-06-25 VITALS — BP 130/72 | HR 60 | Temp 98.4°F | Resp 16 | Ht 64.0 in | Wt 206.8 lb

## 2022-06-25 DIAGNOSIS — K219 Gastro-esophageal reflux disease without esophagitis: Secondary | ICD-10-CM | POA: Diagnosis not present

## 2022-06-25 DIAGNOSIS — I10 Essential (primary) hypertension: Secondary | ICD-10-CM

## 2022-06-25 DIAGNOSIS — J3089 Other allergic rhinitis: Secondary | ICD-10-CM | POA: Diagnosis not present

## 2022-06-25 MED ORDER — FLUTICASONE PROPIONATE 50 MCG/ACT NA SUSP: 2.0000 | Freq: Every day | NASAL | 3 refills | Status: DC

## 2022-06-25 MED ORDER — AMLODIPINE BESYLATE 2.5 MG PO TABS: 2.5000 mg | ORAL_TABLET | Freq: Every day | ORAL | 3 refills | Status: DC

## 2022-06-25 NOTE — Progress Notes (Signed)
[facility] [address] [place], [place] [address]  Internal MEDICINE  Office Visit Note  Patient Name: Margaret Lynch  [id number]  [id number]  Date of Service: 06/25/2022  Chief Complaint  Patient presents with   Follow-up   Hypertension   Gastroesophageal Reflux    HPI [name] presents for a follow-up visit for hypertension, GERD, and refills.  Hypertension -- well controlled with current medications  GERD -- takes omeprazole almost every day  Allergic rhinitis -- uses flonase. Perennial, off and on throughout the year.     Current Medication: Outpatient Encounter Medications as of 06/25/2022  Medication Sig   atenolol (TENORMIN) 25 MG tablet TAKE 1 TABLET BY MOUTH AT  BEDTIME   clotrimazole (GYNE-LOTRIMIN) 1 % vaginal cream Place 1 Applicatorful vaginally at bedtime.   ketoconazole (NIZORAL) 2 % cream Apply 1 application. topically daily.   losartan (COZAAR) 100 MG tablet TAKE 1 TABLET BY MOUTH DAILY   omeprazole (PRILOSEC) 40 MG capsule TAKE 1 CAPSULE BY MOUTH DAILY   [DISCONTINUED] amLODipine (NORVASC) 2.5 MG tablet TAKE 1 TABLET BY MOUTH DAILY   [DISCONTINUED] benzonatate (TESSALON) 100 MG capsule Take 1 capsule (100 mg total) by mouth 2 (two) times daily as needed.   [DISCONTINUED] fluticasone (FLONASE) 50 MCG/ACT nasal spray Place 2 sprays into both nostrils daily.   amLODipine (NORVASC) 2.5 MG tablet Take 1 tablet (2.5 mg total) by mouth daily.   fluticasone (FLONASE) 50 MCG/ACT nasal spray Place 2 sprays into both nostrils daily.   No facility-administered encounter medications on file as of 06/25/2022.    Surgical History: Past Surgical History:  Procedure Laterality Date   COLONOSCOPY WITH PROPOFOL     COLONOSCOPY WITH PROPOFOL N/A 01/15/2019   Procedure: COLONOSCOPY WITH PROPOFOL;  Surgeon: Wyline Mood, MD;  Location: [facility] ENDOSCOPY;  Service: Gastroenterology;  Laterality: N/A;    Medical History: Past Medical History:  Diagnosis Date    Acid reflux    HTN (hypertension)    Nocturia    Overweight    Urinary frequency     Family History: Family History  Problem Relation Age of Onset   Hypertension Mother    Diabetes Mother    Esophageal cancer Father    Kidney disease Neg Hx    Prostate cancer Neg Hx    Bladder Cancer Neg Hx    Breast cancer Neg Hx     Social History   Socioeconomic History   Marital status: Single    Spouse name: Not on file   Number of children: Not on file   Years of education: Not on file   Highest education level: Not on file  Occupational History   Not on file  Tobacco Use   Smoking status: Never   Smokeless tobacco: Never  Vaping Use   Vaping Use: Never used  Substance and Sexual Activity   Alcohol use: No    Alcohol/week: 0.0 standard drinks of alcohol   Drug use: No   Sexual activity: Not on file  Other Topics Concern   Not on file  Social History Narrative   Not on file   Social Determinants of Health   Financial Resource Strain: Not on file  Food Insecurity: Not on file  Transportation Needs: Not on file  Physical Activity: Not on file  Stress: Not on file  Social Connections: Not on file  Intimate Partner Violence: Not on file      Review of Systems  Constitutional:  Negative for chills, fatigue and unexpected  weight change.  HENT:  Negative for congestion, rhinorrhea, sneezing and sore throat.   Eyes:  Negative for redness.  Respiratory:  Negative for cough, chest tightness and shortness of breath.   Cardiovascular:  Negative for chest pain and palpitations.  Gastrointestinal:  Negative for abdominal pain, constipation, diarrhea, nausea and vomiting.  Genitourinary:  Negative for dysuria and frequency.  Musculoskeletal:  Negative for arthralgias, back pain, joint swelling and neck pain.  Skin:  Negative for rash.  Neurological: Negative.  Negative for tremors and numbness.  Hematological:  Negative for adenopathy. Does not bruise/bleed easily.   Psychiatric/Behavioral:  Negative for behavioral problems (Depression), sleep disturbance and suicidal ideas. The patient is not nervous/anxious.     Vital Signs: BP 130/72   Pulse 60   Temp 98.4 F (36.9 C)   Resp 16   Ht 5\' 4"  (1.626 m)   Wt 206 lb 12.8 oz (93.8 kg)   SpO2 97%   BMI 35.50 kg/m    Physical Exam Vitals reviewed.  Constitutional:      Appearance: Normal appearance. She is obese.  HENT:     Head: Normocephalic and atraumatic.  Eyes:     Pupils: Pupils are equal, round, and reactive to light.  Cardiovascular:     Rate and Rhythm: Normal rate and regular rhythm.  Pulmonary:     Effort: Pulmonary effort is normal. No respiratory distress.  Neurological:     Mental Status: She is alert and oriented to person, place, and time.     Cranial Nerves: No cranial nerve deficit.     Coordination: Coordination normal.     Gait: Gait normal.  Psychiatric:        Mood and Affect: Mood normal.        Behavior: Behavior normal.        Assessment/Plan: 1. Essential hypertension Continue amlodipine, atenolol and losartan as prescribed.  - amLODipine (NORVASC) 2.5 MG tablet; Take 1 tablet (2.5 mg total) by mouth daily.  Dispense: 90 tablet; Refill: 3  2. Gastroesophageal reflux disease without esophagitis Continue omeprazole as prescribed   3. Non-seasonal allergic rhinitis due to other allergic trigger Continue flonase as prescribed  - fluticasone (FLONASE) 50 MCG/ACT nasal spray; Place 2 sprays into both nostrils daily.  Dispense: 48 g; Refill: 3   General Counseling: Margaret Lynch verbalizes understanding of the findings of todays visit and agrees with plan of treatment. I have discussed any further diagnostic evaluation that may be needed or ordered today. We also reviewed her medications today. she has been encouraged to call the office with any questions or concerns that should arise related to todays visit.    No orders of the defined types were placed in this  encounter.   Meds ordered this encounter  Medications   amLODipine (NORVASC) 2.5 MG tablet    Sig: Take 1 tablet (2.5 mg total) by mouth daily.    Dispense:  90 tablet    Refill:  3    Please send a replace/new response with 90-Day Supply if appropriate to maximize member benefit. Requesting 1 year supply.   fluticasone (FLONASE) 50 MCG/ACT nasal spray    Sig: Place 2 sprays into both nostrils daily.    Dispense:  48 g    Refill:  3    Return for previously scheduled, CPE, Taleigha Pinson PCP in december.   Total time spent:30 Minutes Time spent includes review of chart, medications, test results, and follow up plan with the patient.   [place] Controlled Substance  Database was reviewed by me.  This patient was seen by Sallyanne Kuster, FNP-C in collaboration with Dr. Beverely Risen as a part of collaborative care agreement.   Nathanel Tallman R. Tedd Sias, MSN, FNP-C Internal medicine

## 2022-07-16 ENCOUNTER — Other Ambulatory Visit: Payer: Self-pay | Admitting: Internal Medicine

## 2022-07-16 DIAGNOSIS — I10 Essential (primary) hypertension: Secondary | ICD-10-CM

## 2022-12-27 ENCOUNTER — Other Ambulatory Visit: Payer: Self-pay

## 2022-12-27 ENCOUNTER — Emergency Department (HOSPITAL_COMMUNITY): Payer: Managed Care, Other (non HMO)

## 2022-12-27 ENCOUNTER — Encounter (HOSPITAL_COMMUNITY): Payer: Self-pay | Admitting: *Deleted

## 2022-12-27 ENCOUNTER — Emergency Department (HOSPITAL_COMMUNITY)
Admission: EM | Admit: 2022-12-27 | Discharge: 2022-12-27 | Disposition: A | Discharge status: Home / Self Care | Payer: Managed Care, Other (non HMO) | Attending: Emergency Medicine | Admitting: Emergency Medicine

## 2022-12-27 DIAGNOSIS — M25562 Pain in left knee: Secondary | ICD-10-CM | POA: Diagnosis present

## 2022-12-27 DIAGNOSIS — S8012XA Contusion of left lower leg, initial encounter: Secondary | ICD-10-CM | POA: Diagnosis not present

## 2022-12-27 DIAGNOSIS — S8002XA Contusion of left knee, initial encounter: Secondary | ICD-10-CM | POA: Insufficient documentation

## 2022-12-27 DIAGNOSIS — W109XXA Fall (on) (from) unspecified stairs and steps, initial encounter: Secondary | ICD-10-CM | POA: Diagnosis not present

## 2022-12-27 NOTE — Discharge Instructions (Signed)
Your left knee x-ray did not show any signs of a fracture or dislocation.  You likely have a bone bruise where your knee hit the stair.  Avoid movements and activities that are painful in the first couple of days after the injury. Elevate the area of injury and wrap with an elastic bandage or tape to help with swelling. . You may use tylenol as needed (up to 1000mg  every 6 hours).   Gradually return to activities as pain tolerates. Try to engage in non-painful types of physical activity/exercise to increase blood flow to your area of injury.  If your knee pain has not started to improve in the next week, please schedule an appointment with the orthopedic office listed below.  Return the ER for any severe worsening of your pain, numbness or tingling in the feet, any other new or concerning symptoms.

## 2022-12-27 NOTE — ED Triage Notes (Signed)
BIB EMS after tripping going up steps this morning. Left knee pain and hit side of left face. Pt ambulatory with EMS 142/94-70-98% RA CBG 124

## 2022-12-27 NOTE — ED Provider Notes (Signed)
[place] EMERGENCY DEPARTMENT AT [facility] Provider Note   CSN: [id number] Arrival date & time: 12/27/22  1025     History  Chief Complaint  Patient presents with   Knee Pain    Margaret Lynch is a 57 y.o. female not on anticoagulation, presents with left knee pain after tripping going up stairs earlier today.  She states her boot caught the stair and caused her to land on her left knee.  Denies any dizziness, chest pain, palpitations, before the fall.  She also reports hitting the left side of her head.  Denies any loss consciousness, change in vision, nausea or vomiting.  Denies any head pain currently.   Knee Pain      Home Medications Prior to Admission medications   Medication Sig Start Date End Date Taking? Authorizing Provider  amLODipine (NORVASC) 2.5 MG tablet TAKE 1 TABLET BY MOUTH DAILY 07/19/22   Sallyanne Kuster, NP  atenolol (TENORMIN) 25 MG tablet TAKE 1 TABLET BY MOUTH AT  BEDTIME 03/26/22   Sallyanne Kuster, NP  clotrimazole (GYNE-LOTRIMIN) 1 % vaginal cream Place 1 Applicatorful vaginally at bedtime. 02/26/22   McDonough, Salomon Fick, PA-C  fluticasone (FLONASE) 50 MCG/ACT nasal spray Place 2 sprays into both nostrils daily. 06/25/22   Sallyanne Kuster, NP  ketoconazole (NIZORAL) 2 % cream Apply 1 application. topically daily. 04/20/21   Sallyanne Kuster, NP  losartan (COZAAR) 100 MG tablet TAKE 1 TABLET BY MOUTH DAILY 04/20/22   Sallyanne Kuster, NP  omeprazole (PRILOSEC) 40 MG capsule TAKE 1 CAPSULE BY MOUTH DAILY 02/08/22   Sallyanne Kuster, NP      Allergies    Isovue [iopamidol] and Ivp dye [iodinated contrast media]    Review of Systems   Review of Systems  Musculoskeletal:        Left knee pain    Physical Exam Updated Vital Signs BP (!) 143/92   Pulse 84   Temp 98.4 F (36.9 C) (Oral)   Resp 16   Ht 5\' 4"  (1.626 m)   Wt 92.1 kg   SpO2 96%   BMI 34.84 kg/m  Physical Exam Vitals and nursing note reviewed.  Constitutional:       Appearance: Normal appearance.  HENT:     Head: Normocephalic and atraumatic.     Comments: No tenderness to palpation of the skull diffusely, no step-offs.  No abrasions or lacerations to the face or scalp diffusely  No loose or missing teeth Neck:     Comments: Able to rotate neck left and right 45 degrees without difficulty Pulmonary:     Effort: Pulmonary effort is normal.  Musculoskeletal:     Comments: No obvious deformity of the left knee.  Mild edema.  No lacerations, ecchymoses. Able to fully flex and extend left knee Tender palpation over the proximal left tibia diffusely, no point tenderness.  No tenderness palpation of the quadriceps tendon, patellar tendon, patella, medial lateral joint lines, popliteal fossa Intact sensation of the left lower extremity Able to ambulate without difficulty  Neurological:     General: No focal deficit present.     Mental Status: She is alert.  Psychiatric:        Mood and Affect: Mood normal.        Behavior: Behavior normal.     ED Results / Procedures / Treatments   Labs (all labs ordered are listed, but only abnormal results are displayed) Labs Reviewed - No data to display  EKG None  Radiology  DG Knee Complete 4 Views Left  Result Date: 12/27/2022 CLINICAL DATA:  Pain after fall EXAM: LEFT KNEE - COMPLETE 4 VIEW COMPARISON:  None Available. FINDINGS: No fracture or dislocation. Preserved joint spaces and bone mineralization. Trace joint fluid on lateral view. IMPRESSION: Trace joint effusion.  No acute osseous abnormality Electronically Signed   By: Karen Kays M.D.   On: 12/27/2022 11:44    Procedures Procedures    Medications Ordered in ED Medications - No data to display  ED Course/ Medical Decision Making/ A&P                                 Medical Decision Making Amount and/or Complexity of Data Reviewed Radiology: ordered.     Differential diagnosis includes but is not limited to fracture,  dislocation, ligamentous injury, bone contusion  ED Course:  Patient well-appearing, able to ambulate without difficulty.  She reports mechanical fall after tripping on her boot and hitting her left knee. No concern for other cause to her fall at this time given she denies any other prodromal symptoms.  She does not have any abrasions to the knee, full range of motion, intact sensation.  No ligamentous laxity appreciated on exam.  Mild tenderness to palpation over the proximal left tibia.  However, x-rays without any signs of fracture or dislocation.  Suspect bone contusion.  She also reported hitting left side of her head.  However, denies any pain here currently. No tenderness to palpation to the skull diffusely. No focal neuro deficits.  No loss of consciousness, no nausea or vomiting, she is not on any anticoagulation. Do not feel she needs head CT at this time.  Feel patient is appropriate for discharge from this time.  She was performing [employer] duty when this happened and had some paperwork.  This was filled out for patient.   Impression: Left knee contusion  Disposition:  The patient was discharged home with instructions to take Tylenol as needed for pain.  Rest and elevate need to help with swelling.  Follow-up with orthopedics if the pain does not start to improve within the next week. Return precautions given.   Imaging Studies ordered: I ordered imaging studies including left knee xray  I independently visualized the imaging with scope of interpretation limited to determining acute life threatening conditions related to emergency care. Imaging showed no fractures or dislocations I agree with the radiologist interpretation              Final Clinical Impression(s) / ED Diagnoses Final diagnoses:  Contusion of left knee and lower leg, initial encounter    Rx / DC Orders ED Discharge Orders     None         Arabella Merles, PA-C 12/27/22 1307    Lorre Nick, MD 12/28/22 [phone]

## 2023-01-02 ENCOUNTER — Other Ambulatory Visit: Payer: Self-pay | Admitting: Nurse Practitioner

## 2023-01-02 DIAGNOSIS — Z0001 Encounter for general adult medical examination with abnormal findings: Secondary | ICD-10-CM

## 2023-01-05 ENCOUNTER — Ambulatory Visit (INDEPENDENT_AMBULATORY_CARE_PROVIDER_SITE_OTHER): Payer: Managed Care, Other (non HMO) | Admitting: Nurse Practitioner

## 2023-01-05 ENCOUNTER — Encounter: Payer: Self-pay | Admitting: Nurse Practitioner

## 2023-01-05 VITALS — BP 126/82 | HR 75 | Temp 98.1°F | Resp 16 | Ht 64.0 in | Wt 204.6 lb

## 2023-01-05 DIAGNOSIS — L209 Atopic dermatitis, unspecified: Secondary | ICD-10-CM

## 2023-01-05 DIAGNOSIS — J3089 Other allergic rhinitis: Secondary | ICD-10-CM | POA: Diagnosis not present

## 2023-01-05 DIAGNOSIS — K219 Gastro-esophageal reflux disease without esophagitis: Secondary | ICD-10-CM

## 2023-01-05 DIAGNOSIS — I10 Essential (primary) hypertension: Secondary | ICD-10-CM

## 2023-01-05 DIAGNOSIS — Z0001 Encounter for general adult medical examination with abnormal findings: Secondary | ICD-10-CM | POA: Diagnosis not present

## 2023-01-05 MED ORDER — FLUTICASONE PROPIONATE 50 MCG/ACT NA SUSP: 2.0000 | Freq: Every day | NASAL | 3 refills | Status: AC

## 2023-01-05 MED ORDER — TRIAMCINOLONE ACETONIDE 0.5 % EX OINT: 1.0000 | TOPICAL_OINTMENT | Freq: Two times a day (BID) | CUTANEOUS | 5 refills | Status: AC

## 2023-01-05 NOTE — Progress Notes (Signed)
[facility] [address] [place], [place] [address]  Internal MEDICINE  Office Visit Note  Patient Name: Margaret Lynch  [id number]  [id number]  Date of Service: 01/05/2023  Chief Complaint  Patient presents with   Gastroesophageal Reflux   Hypertension   Annual Exam    HPI Relinda presents for an annual well visit and physical exam.  Well-appearing 57 y.o. female with hypertension, GERD, and prediabetes Routine mammogram: due in February  Pap smear: due in 2027 Labs: due for routine labs  New or worsening pain: none  Other concerns: getting over a cold, congestion and cough are lingering. Symptoms started about 2 weeks ago.  Needs nasal spray refill.  Has dry and cracked hands with patches of eczema esp on the knuckles.    Current Medication: Outpatient Encounter Medications as of 01/05/2023  Medication Sig   amLODipine (NORVASC) 2.5 MG tablet TAKE 1 TABLET BY MOUTH DAILY   atenolol (TENORMIN) 25 MG tablet TAKE 1 TABLET BY MOUTH AT  BEDTIME   clotrimazole (GYNE-LOTRIMIN) 1 % vaginal cream Place 1 Applicatorful vaginally at bedtime.   ketoconazole (NIZORAL) 2 % cream Apply 1 application. topically daily.   losartan (COZAAR) 100 MG tablet TAKE 1 TABLET BY MOUTH DAILY   omeprazole (PRILOSEC) 40 MG capsule TAKE 1 CAPSULE BY MOUTH DAILY   triamcinolone ointment (KENALOG) 0.5 % Apply 1 Application topically 2 (two) times daily. To affected area on both hands until resolved.   [DISCONTINUED] fluticasone (FLONASE) 50 MCG/ACT nasal spray Place 2 sprays into both nostrils daily.   fluticasone (FLONASE) 50 MCG/ACT nasal spray Place 2 sprays into both nostrils daily.   No facility-administered encounter medications on file as of 01/05/2023.    Surgical History: Past Surgical History:  Procedure Laterality Date   COLONOSCOPY WITH PROPOFOL     COLONOSCOPY WITH PROPOFOL N/A 01/15/2019   Procedure: COLONOSCOPY WITH PROPOFOL;  Surgeon: Wyline Mood, MD;  Location: [facility]  ENDOSCOPY;  Service: Gastroenterology;  Laterality: N/A;    Medical History: Past Medical History:  Diagnosis Date   Acid reflux    HTN (hypertension)    Nocturia    Overweight    Urinary frequency     Family History: Family History  Problem Relation Age of Onset   Hypertension Mother    Diabetes Mother    Esophageal cancer Father    Kidney disease Neg Hx    Prostate cancer Neg Hx    Bladder Cancer Neg Hx    Breast cancer Neg Hx     Social History   Socioeconomic History   Marital status: Single    Spouse name: Not on file   Number of children: Not on file   Years of education: Not on file   Highest education level: Not on file  Occupational History   Not on file  Tobacco Use   Smoking status: Never   Smokeless tobacco: Never  Vaping Use   Vaping status: Never Used  Substance and Sexual Activity   Alcohol use: No    Alcohol/week: 0.0 standard drinks of alcohol   Drug use: No   Sexual activity: Not on file  Other Topics Concern   Not on file  Social History Narrative   Not on file   Social Determinants of Health   Financial Resource Strain: Not on file  Food Insecurity: Not on file  Transportation Needs: Not on file  Physical Activity: Not on file  Stress: Not on file  Social Connections: Not on file  Intimate Partner Violence: Not on file      Review of Systems  Constitutional:  Negative for activity change, appetite change, chills, fatigue, fever and unexpected weight change.  HENT: Negative.  Negative for congestion, ear pain, rhinorrhea, sore throat and trouble swallowing.   Eyes: Negative.   Respiratory: Negative.  Negative for cough, chest tightness, shortness of breath and wheezing.   Cardiovascular: Negative.  Negative for chest pain.  Gastrointestinal: Negative.  Negative for abdominal pain, blood in stool, constipation, diarrhea, nausea and vomiting.  Endocrine: Negative.   Genitourinary: Negative.  Negative for difficulty urinating,  dysuria, frequency, hematuria and urgency.  Musculoskeletal: Negative.  Negative for arthralgias, back pain, joint swelling, myalgias and neck pain.  Skin: Negative.  Negative for rash and wound.  Allergic/Immunologic: Negative.  Negative for immunocompromised state.  Neurological: Negative.  Negative for dizziness, seizures, numbness and headaches.  Hematological: Negative.   Psychiatric/Behavioral: Negative.  Negative for behavioral problems, self-injury and suicidal ideas. The patient is not nervous/anxious.     Vital Signs: BP 126/82   Pulse 75   Temp 98.1 F (36.7 C)   Resp 16   Ht 5\' 4"  (1.626 m)   Wt 204 lb 9.6 oz (92.8 kg)   SpO2 99%   BMI 35.12 kg/m    Physical Exam Vitals reviewed.  Constitutional:      General: She is not in acute distress.    Appearance: She is well-developed. She is not diaphoretic.  HENT:     Head: Normocephalic and atraumatic.     Right Ear: External ear normal.     Left Ear: External ear normal.     Nose: Nose normal.     Mouth/Throat:     Pharynx: No oropharyngeal exudate.  Eyes:     General: No scleral icterus.       Right eye: No discharge.        Left eye: No discharge.     Conjunctiva/sclera: Conjunctivae normal.     Pupils: Pupils are equal, round, and reactive to light.  Neck:     Thyroid: No thyromegaly.     Vascular: No JVD.     Trachea: No tracheal deviation.  Cardiovascular:     Rate and Rhythm: Normal rate and regular rhythm.     Heart sounds: Normal heart sounds. No murmur heard.    No friction rub. No gallop.  Pulmonary:     Effort: Pulmonary effort is normal. No respiratory distress.     Breath sounds: Normal breath sounds. No stridor. No wheezing or rales.  Chest:     Chest wall: No tenderness.  Breasts:    Breasts are symmetrical.     Right: Normal. No swelling, bleeding, inverted nipple, mass, nipple discharge, skin change or tenderness.     Left: Normal. No swelling, bleeding, inverted nipple, mass, nipple  discharge, skin change or tenderness.  Abdominal:     General: Bowel sounds are normal. There is no distension.     Palpations: Abdomen is soft. There is no mass.     Tenderness: There is no abdominal tenderness. There is no guarding or rebound.  Musculoskeletal:        General: No tenderness or deformity. Normal range of motion.     Cervical back: Normal range of motion and neck supple.  Lymphadenopathy:     Cervical: No cervical adenopathy.     Upper Body:     Right upper body: No supraclavicular, axillary or pectoral adenopathy.     Left  upper body: No supraclavicular, axillary or pectoral adenopathy.  Skin:    General: Skin is warm and dry.     Coloration: Skin is not pale.     Findings: Rash (patchy eczematous rash on both hands, with dried and cracked area on knuckles.) present. No erythema.  Neurological:     Mental Status: She is alert.     Cranial Nerves: No cranial nerve deficit.     Motor: No abnormal muscle tone.     Coordination: Coordination normal.     Deep Tendon Reflexes: Reflexes are normal and symmetric.  Psychiatric:        Behavior: Behavior normal.        Thought Content: Thought content normal.        Judgment: Judgment normal.        Assessment/Plan: 1. Encounter for routine adult health examination with abnormal findings Age-appropriate preventive screenings and vaccinations discussed, annual physical exam completed. Routine labs for health maintenance will be ordered, requisition form given to patient. Marland Kitchen PHM updated.   2. Essential hypertension Stable, continue losartan, atenolol and amlodipine as prescribed.   3. Gastroesophageal reflux disease without esophagitis Avoid triggers and take omeprazole as prescribed.   4. Non-seasonal allergic rhinitis due to other allergic trigger Continue nasal spray as needed.  - fluticasone (FLONASE) 50 MCG/ACT nasal spray; Place 2 sprays into both nostrils daily.  Dispense: 48 g; Refill: 3  5. Atopic  dermatitis of both hands Topical steroid ointment prescribed.  - triamcinolone ointment (KENALOG) 0.5 %; Apply 1 Application topically 2 (two) times daily. To affected area on both hands until resolved.  Dispense: 30 g; Refill: 5     General Counseling: Jo-Anne verbalizes understanding of the findings of todays visit and agrees with plan of treatment. I have discussed any further diagnostic evaluation that may be needed or ordered today. We also reviewed her medications today. she has been encouraged to call the office with any questions or concerns that should arise related to todays visit.    No orders of the defined types were placed in this encounter.   Meds ordered this encounter  Medications   fluticasone (FLONASE) 50 MCG/ACT nasal spray    Sig: Place 2 sprays into both nostrils daily.    Dispense:  48 g    Refill:  3   triamcinolone ointment (KENALOG) 0.5 %    Sig: Apply 1 Application topically 2 (two) times daily. To affected area on both hands until resolved.    Dispense:  30 g    Refill:  5    Return in about 6 months (around 07/06/2023) for F/U, Perseus [facility]all PCP and will call patient with lab results. .   Total time spent:30 Minutes Time spent includes review of chart, medications, test results, and follow up plan with the patient.   [place] Controlled Substance Database was reviewed by me.  This patient was seen by Sallyanne Kuster, FNP-C in collaboration with Dr. Beverely Risen as a part of collaborative care agreement.  Torria Fromer R. Tedd Sias, MSN, FNP-C Internal medicine

## 2023-01-14 ENCOUNTER — Other Ambulatory Visit: Payer: Self-pay | Admitting: Nurse Practitioner

## 2023-01-14 DIAGNOSIS — Z1231 Encounter for screening mammogram for malignant neoplasm of breast: Secondary | ICD-10-CM

## 2023-01-20 ENCOUNTER — Other Ambulatory Visit: Payer: Self-pay | Admitting: Nurse Practitioner

## 2023-01-21 ENCOUNTER — Telehealth: Payer: Self-pay

## 2023-01-21 LAB — CBC WITH DIFFERENTIAL/PLATELET
Basophils Absolute: 0.1 10*3/uL (ref 0.0–0.2)
Basos: 1 %
EOS (ABSOLUTE): 0.2 10*3/uL (ref 0.0–0.4)
Eos: 4 %
Hematocrit: 42.6 % (ref 34.0–46.6)
Hemoglobin: 13.9 g/dL (ref 11.1–15.9)
Immature Grans (Abs): 0 10*3/uL (ref 0.0–0.1)
Immature Granulocytes: 0 %
Lymphocytes Absolute: 1.7 10*3/uL (ref 0.7–3.1)
Lymphs: 27 %
MCH: 30.6 pg (ref 26.6–33.0)
MCHC: 32.6 g/dL (ref 31.5–35.7)
MCV: 94 fL (ref 79–97)
Monocytes Absolute: 0.6 10*3/uL (ref 0.1–0.9)
Monocytes: 10 %
Neutrophils Absolute: 3.6 10*3/uL (ref 1.4–7.0)
Neutrophils: 58 %
Platelets: 205 10*3/uL (ref 150–450)
RBC: 4.54 x10E6/uL (ref 3.77–5.28)
RDW: 12.1 % (ref 11.7–15.4)
WBC: 6.2 10*3/uL (ref 3.4–10.8)

## 2023-01-21 LAB — LIPID PANEL
Chol/HDL Ratio: 3 {ratio} (ref 0.0–4.4)
Cholesterol, Total: 130 mg/dL (ref 100–199)
HDL: 44 mg/dL (ref >39)
LDL Chol Calc (NIH): 75 mg/dL (ref 0–99)
Triglycerides: 47 mg/dL (ref 0–149)
VLDL Cholesterol Cal: 11 mg/dL (ref 5–40)

## 2023-01-21 LAB — COMPREHENSIVE METABOLIC PANEL
ALT: 14 [IU]/L (ref 0–32)
AST: 15 [IU]/L (ref 0–40)
Albumin: 4.3 g/dL (ref 3.8–4.9)
Alkaline Phosphatase: 94 [IU]/L (ref 44–121)
BUN/Creatinine Ratio: 19 (ref 9–23)
BUN: 15 mg/dL (ref 6–24)
Bilirubin Total: 0.5 mg/dL (ref 0.0–1.2)
CO2: 23 mmol/L (ref 20–29)
Calcium: 9.4 mg/dL (ref 8.7–10.2)
Chloride: 103 mmol/L (ref 96–106)
Creatinine, Ser: 0.8 mg/dL (ref 0.57–1.00)
Globulin, Total: 2.1 g/dL (ref 1.5–4.5)
Glucose: 103 mg/dL — ABNORMAL HIGH (ref 70–99)
Potassium: 4.9 mmol/L (ref 3.5–5.2)
Sodium: 140 mmol/L (ref 134–144)
Total Protein: 6.4 g/dL (ref 6.0–8.5)
eGFR: 85 mL/min/{1.73_m2} (ref >59)

## 2023-01-21 LAB — VITAMIN D 25 HYDROXY (VIT D DEFICIENCY, FRACTURES): Vit D, 25-Hydroxy: 24.6 ng/mL — ABNORMAL LOW (ref 30.0–100.0)

## 2023-01-21 NOTE — Telephone Encounter (Signed)
Patient notified

## 2023-01-21 NOTE — Progress Notes (Signed)
Labs results are normal except for low vitamin D at 24.6 which is improved from last year. Take OTC vitamin D supplement of 2000-5000 units daily. We can recheck the lab in 6 to 12 months

## 2023-01-21 NOTE — Telephone Encounter (Signed)
-----   Message from [place] sent at 01/21/2023  8:14 AM EST ----- Labs results are normal except for low vitamin D at 24.6 which is improved from last year. Take OTC vitamin D supplement of 2000-5000 units daily. We can recheck the lab in 6 to 12 months

## 2023-02-08 ENCOUNTER — Other Ambulatory Visit: Payer: Self-pay | Admitting: Nurse Practitioner

## 2023-02-08 DIAGNOSIS — I10 Essential (primary) hypertension: Secondary | ICD-10-CM

## 2023-03-10 ENCOUNTER — Ambulatory Visit
Admission: RE | Admit: 2023-03-10 | Discharge: 2023-03-10 | Disposition: A | Discharge status: Home / Self Care | Payer: Managed Care, Other (non HMO) | Source: Ambulatory Visit | Attending: Nurse Practitioner | Admitting: Nurse Practitioner

## 2023-03-10 DIAGNOSIS — Z1231 Encounter for screening mammogram for malignant neoplasm of breast: Secondary | ICD-10-CM | POA: Insufficient documentation

## 2023-03-11 ENCOUNTER — Other Ambulatory Visit: Payer: Self-pay | Admitting: Nurse Practitioner

## 2023-03-11 DIAGNOSIS — I10 Essential (primary) hypertension: Secondary | ICD-10-CM

## 2023-05-13 ENCOUNTER — Ambulatory Visit: Admitting: Nurse Practitioner

## 2023-05-13 ENCOUNTER — Encounter: Payer: Self-pay | Admitting: Nurse Practitioner

## 2023-05-13 VITALS — BP 138/80 | HR 83 | Temp 98.1°F | Resp 16 | Ht 64.0 in | Wt 199.8 lb

## 2023-05-13 DIAGNOSIS — N390 Urinary tract infection, site not specified: Secondary | ICD-10-CM | POA: Diagnosis not present

## 2023-05-13 DIAGNOSIS — R319 Hematuria, unspecified: Secondary | ICD-10-CM | POA: Diagnosis not present

## 2023-05-13 DIAGNOSIS — R300 Dysuria: Secondary | ICD-10-CM | POA: Diagnosis not present

## 2023-05-13 DIAGNOSIS — R040 Epistaxis: Secondary | ICD-10-CM

## 2023-05-13 DIAGNOSIS — B3731 Acute candidiasis of vulva and vagina: Secondary | ICD-10-CM | POA: Diagnosis not present

## 2023-05-13 DIAGNOSIS — N3001 Acute cystitis with hematuria: Secondary | ICD-10-CM | POA: Diagnosis not present

## 2023-05-13 LAB — POCT URINALYSIS DIPSTICK
Bilirubin, UA: NEGATIVE
Glucose, UA: NEGATIVE
Nitrite, UA: NEGATIVE
Protein, UA: NEGATIVE
Spec Grav, UA: 1.02 (ref 1.010–1.025)
Urobilinogen, UA: 0.2 U/dL
pH, UA: 5 (ref 5.0–8.0)

## 2023-05-13 MED ORDER — FLUCONAZOLE 150 MG PO TABS: 150.0000 mg | ORAL_TABLET | Freq: Once | ORAL | 0 refills | Status: AC

## 2023-05-13 MED ORDER — NITROFURANTOIN MONOHYD MACRO 100 MG PO CAPS: 100.0000 mg | ORAL_CAPSULE | Freq: Two times a day (BID) | ORAL | 0 refills | Status: AC

## 2023-05-13 NOTE — Progress Notes (Signed)
 [facility] [address] [place], [place] [address]  Internal MEDICINE  Office Visit Note  Patient Name: Margaret Lynch  [id number]  [id number]  Date of Service: 05/13/2023  Chief Complaint  Patient presents with   Acute Visit     HPI Margaret Lynch presents for an acute sick visit for symptoms of UTI and yeast infection.  --onset of symptoms was about 5 days ago.  --vaginal irritation, vaginal itching, frequency and urgency, urinary discomfort, back pain, suprapubic tenderness, burning with urination.  --urinalysis showed large blood and moderate leukocytes. Negative fore nitrites.     Current Medication:  Outpatient Encounter Medications as of 05/13/2023  Medication Sig   amLODipine (NORVASC) 2.5 MG tablet TAKE 1 TABLET BY MOUTH DAILY   atenolol (TENORMIN) 25 MG tablet TAKE 1 TABLET BY MOUTH AT  BEDTIME   clotrimazole (GYNE-LOTRIMIN) 1 % vaginal cream Place 1 Applicatorful vaginally at bedtime.   fluconazole (DIFLUCAN) 150 MG tablet Take 1 tablet (150 mg total) by mouth once for 1 dose. May take an additional dose after 3 days if still symptomatic.   fluticasone (FLONASE) 50 MCG/ACT nasal spray Place 2 sprays into both nostrils daily.   ketoconazole (NIZORAL) 2 % cream Apply 1 application. topically daily.   losartan (COZAAR) 100 MG tablet TAKE 1 TABLET BY MOUTH DAILY   nitrofurantoin, macrocrystal-monohydrate, (MACROBID) 100 MG capsule Take 1 capsule (100 mg total) by mouth 2 (two) times daily for 7 days. Take with food   omeprazole (PRILOSEC) 40 MG capsule TAKE 1 CAPSULE BY MOUTH DAILY   triamcinolone ointment (KENALOG) 0.5 % Apply 1 Application topically 2 (two) times daily. To affected area on both hands until resolved.   No facility-administered encounter medications on file as of 05/13/2023.      Medical History: Past Medical History:  Diagnosis Date   Acid reflux    HTN (hypertension)    Nocturia    Overweight    Urinary frequency      Vital  Signs: BP 138/80   Pulse 83   Temp 98.1 F (36.7 C)   Resp 16   Ht 5\' 4"  (1.626 m)   Wt 199 lb 12.8 oz (90.6 kg)   SpO2 95%   BMI 34.30 kg/m    Review of Systems  Constitutional: Negative.  Negative for fatigue.  HENT:  Positive for nosebleeds (has been using flonase and this has dried out her nose).   Respiratory: Negative.  Negative for cough, chest tightness, shortness of breath and wheezing.   Cardiovascular: Negative.  Negative for chest pain and palpitations.  Genitourinary:  Positive for dysuria, frequency, hematuria and urgency.       Vaginal irritation and itching    Physical Exam Vitals and nursing note reviewed.  Constitutional:      Appearance: Normal appearance.  HENT:     Head: Normocephalic and atraumatic.  Eyes:     Pupils: Pupils are equal, round, and reactive to light.  Cardiovascular:     Rate and Rhythm: Normal rate and regular rhythm.     Pulses: Normal pulses.     Heart sounds: Normal heart sounds.  Abdominal:     Tenderness: There is abdominal tenderness in the suprapubic area.  Skin:    General: Skin is warm and dry.  Neurological:     General: No focal deficit present.     Mental Status: She is alert and oriented to person, place, and time.  Psychiatric:        Mood and  Affect: Mood normal.        Behavior: Behavior normal.       Assessment/Plan: 1. Acute cystitis with hematuria (Primary) Urine culture sent. Nitrofurantoin prescribed, take until gone.  - POCT urinalysis dipstick - CULTURE, URINE COMPREHENSIVE - nitrofurantoin, macrocrystal-monohydrate, (MACROBID) 100 MG capsule; Take 1 capsule (100 mg total) by mouth 2 (two) times daily for 7 days. Take with food  Dispense: 14 capsule; Refill: 0  2. Vulvovaginal candidiasis Fluconazole prescribed.  - fluconazole (DIFLUCAN) 150 MG tablet; Take 1 tablet (150 mg total) by mouth once for 1 dose. May take an additional dose after 3 days if still symptomatic.  Dispense: 3 tablet; Refill:  0  3. Dysuria Urinalysis showed blood and leukocytes.   4. Epistaxis Stop flonase. Use saline nasal spray   General Counseling: Margaret Lynch understanding of the findings of todays visit and agrees with plan of treatment. I have discussed any further diagnostic evaluation that may be needed or ordered today. We also reviewed her medications today. she has been encouraged to call the office with any questions or concerns that should arise related to todays visit.    Counseling:    Orders Placed This Encounter  Procedures   CULTURE, URINE COMPREHENSIVE   POCT urinalysis dipstick    Meds ordered this encounter  Medications   nitrofurantoin, macrocrystal-monohydrate, (MACROBID) 100 MG capsule    Sig: Take 1 capsule (100 mg total) by mouth 2 (two) times daily for 7 days. Take with food    Dispense:  14 capsule    Refill:  0    Fill new script today   fluconazole (DIFLUCAN) 150 MG tablet    Sig: Take 1 tablet (150 mg total) by mouth once for 1 dose. May take an additional dose after 3 days if still symptomatic.    Dispense:  3 tablet    Refill:  0    Fill new script today    Return if symptoms worsen or fail to improve, for keep follow up scheduled in june.  [place] Controlled Substance Database was reviewed by me for overdose risk score (ORS)  Time spent:30 Minutes Time spent with patient included reviewing progress notes, labs, imaging studies, and discussing plan for follow up.   This patient was seen by Sallyanne Kuster, FNP-C in collaboration with Dr. Beverely Risen as a part of collaborative care agreement.  Kriti Katayama R. Tedd Sias, MSN, FNP-C Internal Medicine

## 2023-05-17 LAB — CULTURE, URINE COMPREHENSIVE

## 2023-05-31 ENCOUNTER — Other Ambulatory Visit: Payer: Self-pay | Admitting: Nurse Practitioner

## 2023-05-31 MED ORDER — CIPROFLOXACIN HCL 500 MG PO TABS: 500.0000 mg | ORAL_TABLET | Freq: Two times a day (BID) | ORAL | 0 refills | Status: AC

## 2023-06-01 NOTE — Progress Notes (Signed)
 Lmom to call us back

## 2023-06-03 NOTE — Progress Notes (Signed)
 Patient notified

## 2023-06-04 ENCOUNTER — Other Ambulatory Visit: Payer: Self-pay | Admitting: Nurse Practitioner

## 2023-06-04 DIAGNOSIS — I10 Essential (primary) hypertension: Secondary | ICD-10-CM

## 2023-07-06 ENCOUNTER — Ambulatory Visit: Payer: Managed Care, Other (non HMO) | Admitting: Nurse Practitioner

## 2023-07-07 ENCOUNTER — Ambulatory Visit: Admitting: Nurse Practitioner

## 2023-07-07 ENCOUNTER — Encounter: Payer: Self-pay | Admitting: Nurse Practitioner

## 2023-07-07 VITALS — BP 136/88 | HR 68 | Temp 98.1°F | Resp 16 | Ht 64.0 in | Wt 202.6 lb

## 2023-07-07 DIAGNOSIS — I10 Essential (primary) hypertension: Secondary | ICD-10-CM | POA: Diagnosis not present

## 2023-07-07 DIAGNOSIS — K219 Gastro-esophageal reflux disease without esophagitis: Secondary | ICD-10-CM | POA: Diagnosis not present

## 2023-07-07 DIAGNOSIS — E559 Vitamin D deficiency, unspecified: Secondary | ICD-10-CM | POA: Diagnosis not present

## 2023-07-07 NOTE — Progress Notes (Signed)
 [facility] [address] [place], [place] [address]  Internal MEDICINE  Office Visit Note  Patient Name: Margaret Lynch  [id number]  [id number]  Date of Service: 07/07/2023  Chief Complaint  Patient presents with   Gastroesophageal Reflux   Hypertension   Follow-up   HPI  Margaret Lynch presents for a follow-up visit for hypertension, GERD, and low vitamin D   Hypertension -- controlled with 3 BP medications.  GERD -- controlled with omeprazole  Vitamin d  deficiency -- taking OTC supplement.     Current Medication: Outpatient Encounter Medications as of 07/07/2023  Medication Sig   amLODipine  (NORVASC ) 2.5 MG tablet TAKE 1 TABLET BY MOUTH DAILY   atenolol  (TENORMIN ) 25 MG tablet TAKE 1 TABLET BY MOUTH AT  BEDTIME   clotrimazole  (GYNE-LOTRIMIN ) 1 % vaginal cream Place 1 Applicatorful vaginally at bedtime.   fluticasone  (FLONASE ) 50 MCG/ACT nasal spray Place 2 sprays into both nostrils daily.   ketoconazole  (NIZORAL ) 2 % cream Apply 1 application. topically daily.   losartan  (COZAAR ) 100 MG tablet TAKE 1 TABLET BY MOUTH DAILY   omeprazole  (PRILOSEC) 40 MG capsule TAKE 1 CAPSULE BY MOUTH DAILY   triamcinolone  ointment (KENALOG ) 0.5 % Apply 1 Application topically 2 (two) times daily. To affected area on both hands until resolved.   No facility-administered encounter medications on file as of 07/07/2023.    Surgical History: Past Surgical History:  Procedure Laterality Date   COLONOSCOPY WITH PROPOFOL      COLONOSCOPY WITH PROPOFOL  N/A 01/15/2019   Procedure: COLONOSCOPY WITH PROPOFOL ;  Surgeon: Therisa Bi, MD;  Location: [facility] ENDOSCOPY;  Service: Gastroenterology;  Laterality: N/A;    Medical History: Past Medical History:  Diagnosis Date   Acid reflux    HTN (hypertension)    Nocturia    Overweight    Urinary frequency     Family History: Family History  Problem Relation Age of Onset   Hypertension Mother    Diabetes Mother    Esophageal cancer Father     Kidney disease Neg Hx    Prostate cancer Neg Hx    Bladder Cancer Neg Hx    Breast cancer Neg Hx     Social History   Socioeconomic History   Marital status: Single    Spouse name: Not on file   Number of children: Not on file   Years of education: Not on file   Highest education level: Not on file  Occupational History   Not on file  Tobacco Use   Smoking status: Never   Smokeless tobacco: Never  Vaping Use   Vaping status: Never Used  Substance and Sexual Activity   Alcohol use: No    Alcohol/week: 0.0 standard drinks of alcohol   Drug use: No   Sexual activity: Not on file  Other Topics Concern   Not on file  Social History Narrative   Not on file   Social Drivers of Health   Financial Resource Strain: Not on file  Food Insecurity: Not on file  Transportation Needs: Not on file  Physical Activity: Not on file  Stress: Not on file  Social Connections: Not on file  Intimate Partner Violence: Not on file      Review of Systems  Constitutional:  Negative for chills, fatigue and unexpected weight change.  HENT:  Negative for congestion, rhinorrhea, sneezing and sore throat.   Eyes:  Negative for redness.  Respiratory:  Negative for cough, chest tightness and shortness of breath.   Cardiovascular:  Negative for  chest pain and palpitations.  Gastrointestinal:  Negative for abdominal pain, constipation, diarrhea, nausea and vomiting.  Genitourinary:  Negative for dysuria and frequency.  Musculoskeletal:  Negative for arthralgias, back pain, joint swelling and neck pain.  Skin:  Negative for rash.  Neurological: Negative.  Negative for tremors and numbness.  Hematological:  Negative for adenopathy. Does not bruise/bleed easily.  Psychiatric/Behavioral:  Negative for behavioral problems (Depression), sleep disturbance and suicidal ideas. The patient is not nervous/anxious.     Vital Signs: BP 136/88   Pulse 68   Temp 98.1 F (36.7 C)   Resp 16   Ht 5' 4  (1.626 m)   Wt 202 lb 9.6 oz (91.9 kg)   SpO2 98%   BMI 34.78 kg/m    Physical Exam Vitals reviewed.  Constitutional:      Appearance: Normal appearance. She is obese.  HENT:     Head: Normocephalic and atraumatic.  Eyes:     Pupils: Pupils are equal, round, and reactive to light.  Cardiovascular:     Rate and Rhythm: Normal rate and regular rhythm.  Pulmonary:     Effort: Pulmonary effort is normal. No respiratory distress.  Neurological:     Mental Status: She is alert and oriented to person, place, and time.     Cranial Nerves: No cranial nerve deficit.     Coordination: Coordination normal.     Gait: Gait normal.  Psychiatric:        Mood and Affect: Mood normal.        Behavior: Behavior normal.        Assessment/Plan: 1. Essential hypertension (Primary) Stable, continue losartan , amlodipine  and atenolol  as prescribed.   2. Vitamin D  deficiency Continue OTC supplement   3. Gastroesophageal reflux disease without esophagitis Continue omeprazole  as prescribed.    General Counseling: Margaret Lynch understanding of the findings of todays visit and agrees with plan of treatment. I have discussed any further diagnostic evaluation that may be needed or ordered today. We also reviewed her medications today. she has been encouraged to call the office with any questions or concerns that should arise related to todays visit.    No orders of the defined types were placed in this encounter.   No orders of the defined types were placed in this encounter.   Return for previously scheduled, AWV, Jenipher Havel PCPin december, have labs done before visit .   Total time spent:30 Minutes Time spent includes review of chart, medications, test results, and follow up plan with the patient.   [place] Controlled Substance Database was reviewed by me.  This patient was seen by [name], FNP-C in collaboration with Dr. Sigrid Bathe as a part of collaborative care  agreement.   Jerine Surles R. Maxin, MSN, FNP-C Internal medicine

## 2023-08-01 ENCOUNTER — Encounter: Payer: Self-pay | Admitting: Nurse Practitioner

## 2023-11-14 ENCOUNTER — Other Ambulatory Visit: Payer: Self-pay | Admitting: Internal Medicine

## 2023-11-14 DIAGNOSIS — Z0001 Encounter for general adult medical examination with abnormal findings: Secondary | ICD-10-CM

## 2023-12-20 ENCOUNTER — Other Ambulatory Visit: Payer: Self-pay | Admitting: Nurse Practitioner

## 2023-12-20 DIAGNOSIS — I10 Essential (primary) hypertension: Secondary | ICD-10-CM

## 2023-12-27 ENCOUNTER — Other Ambulatory Visit: Payer: Self-pay | Admitting: Nurse Practitioner

## 2023-12-28 LAB — LIPID PANEL
Chol/HDL Ratio: 3 ratio (ref 0.0–4.4)
Cholesterol, Total: 147 mg/dL (ref 100–199)
HDL: 49 mg/dL (ref >39)
LDL Chol Calc (NIH): 87 mg/dL (ref 0–99)
Triglycerides: 49 mg/dL (ref 0–149)
VLDL Cholesterol Cal: 11 mg/dL (ref 5–40)

## 2023-12-28 LAB — COMPREHENSIVE METABOLIC PANEL WITH GFR
ALT: 16 IU/L (ref 0–32)
AST: 18 IU/L (ref 0–40)
Albumin: 4.7 g/dL (ref 3.8–4.9)
Alkaline Phosphatase: 90 IU/L (ref 49–135)
BUN/Creatinine Ratio: 17 (ref 12–28)
BUN: 14 mg/dL (ref 8–27)
Bilirubin Total: 0.3 mg/dL (ref 0.0–1.2)
CO2: 22 mmol/L (ref 20–29)
Calcium: 10.2 mg/dL (ref 8.7–10.3)
Chloride: 102 mmol/L (ref 96–106)
Creatinine, Ser: 0.82 mg/dL (ref 0.57–1.00)
Globulin, Total: 2.7 g/dL (ref 1.5–4.5)
Glucose: 111 mg/dL — ABNORMAL HIGH (ref 70–99)
Potassium: 5.1 mmol/L (ref 3.5–5.2)
Sodium: 141 mmol/L (ref 134–144)
Total Protein: 7.4 g/dL (ref 6.0–8.5)
eGFR: 82 mL/min/1.73 (ref >59)

## 2023-12-28 LAB — CBC WITH DIFFERENTIAL/PLATELET
Basophils Absolute: 0.1 x10E3/uL (ref 0.0–0.2)
Basos: 1 %
EOS (ABSOLUTE): 0.1 x10E3/uL (ref 0.0–0.4)
Eos: 2 %
Hematocrit: 44.9 % (ref 34.0–46.6)
Hemoglobin: 14.9 g/dL (ref 11.1–15.9)
Immature Grans (Abs): 0 x10E3/uL (ref 0.0–0.1)
Immature Granulocytes: 0 %
Lymphocytes Absolute: 1.2 x10E3/uL (ref 0.7–3.1)
Lymphs: 24 %
MCH: 31 pg (ref 26.6–33.0)
MCHC: 33.2 g/dL (ref 31.5–35.7)
MCV: 93 fL (ref 79–97)
Monocytes Absolute: 0.5 x10E3/uL (ref 0.1–0.9)
Monocytes: 10 %
Neutrophils Absolute: 3.1 x10E3/uL (ref 1.4–7.0)
Neutrophils: 63 %
Platelets: 215 x10E3/uL (ref 150–450)
RBC: 4.81 x10E6/uL (ref 3.77–5.28)
RDW: 12.1 % (ref 11.7–15.4)
WBC: 5.1 x10E3/uL (ref 3.4–10.8)

## 2023-12-28 LAB — HGB A1C W/O EAG: Hgb A1c MFr Bld: 5.7 % — ABNORMAL HIGH (ref 4.8–5.6)

## 2023-12-28 LAB — VITAMIN D 25 HYDROXY (VIT D DEFICIENCY, FRACTURES): Vit D, 25-Hydroxy: 40.3 ng/mL (ref 30.0–100.0)

## 2024-01-11 ENCOUNTER — Ambulatory Visit: Payer: Managed Care, Other (non HMO) | Admitting: Nurse Practitioner

## 2024-01-11 ENCOUNTER — Encounter: Payer: Self-pay | Admitting: Nurse Practitioner

## 2024-01-11 VITALS — BP 136/82 | HR 84 | Temp 97.7°F | Resp 16 | Ht 64.0 in | Wt 202.0 lb

## 2024-01-11 DIAGNOSIS — H9193 Unspecified hearing loss, bilateral: Secondary | ICD-10-CM | POA: Diagnosis not present

## 2024-01-11 DIAGNOSIS — R300 Dysuria: Secondary | ICD-10-CM

## 2024-01-11 DIAGNOSIS — Z23 Encounter for immunization: Secondary | ICD-10-CM | POA: Diagnosis not present

## 2024-01-11 DIAGNOSIS — I10 Essential (primary) hypertension: Secondary | ICD-10-CM

## 2024-01-11 DIAGNOSIS — Z0001 Encounter for general adult medical examination with abnormal findings: Secondary | ICD-10-CM

## 2024-01-11 DIAGNOSIS — E559 Vitamin D deficiency, unspecified: Secondary | ICD-10-CM | POA: Diagnosis not present

## 2024-01-11 DIAGNOSIS — R7303 Prediabetes: Secondary | ICD-10-CM | POA: Insufficient documentation

## 2024-01-11 MED ORDER — PNEUMOCOCCAL 20-VAL CONJ VACC 0.5 ML IM SUSY: 0.5000 mL | PREFILLED_SYRINGE | Freq: Once | INTRAMUSCULAR | 0 refills | Status: AC | PRN

## 2024-01-11 NOTE — Progress Notes (Signed)
 [facility] [address] [place], [place] [address]  Internal MEDICINE  Office Visit Note  Patient Name: Margaret Lynch  [id number]  [id number]  Date of Service: 01/11/2024  Chief Complaint  Patient presents with   Gastroesophageal Reflux   Hypertension   Annual Exam    HPI Ravinder presents for an annual well visit and physical exam.  Well-appearing 58 y.o. female with hypertension, GERD, and prediabetes  Routine CRC screening: due in 2030 for colonoscopy  Routine mammogram: due in February next year  DEXA scan: due in 5 more years  Pap smear: due in 2027 Labs: results discussed with patient today. A1c is 5.7, prediabetic Vitamin D  has improved to normal level Fasting glucose 111 Normal cholesterol panel and CBC and the rest of the CMP was normal.  New or worsening pain: none  Other concerns: none     Current Medication: Outpatient Encounter Medications as of 01/11/2024  Medication Sig   pneumococcal 20-valent conjugate vaccine (PREVNAR 20) 0.5 ML injection Inject 0.5 mLs into the muscle once as needed for up to 1 dose for immunization.   amLODipine  (NORVASC ) 2.5 MG tablet TAKE 1 TABLET BY MOUTH DAILY   atenolol  (TENORMIN ) 25 MG tablet TAKE 1 TABLET BY MOUTH AT  BEDTIME   fluticasone  (FLONASE ) 50 MCG/ACT nasal spray Place 2 sprays into both nostrils daily.   losartan  (COZAAR ) 100 MG tablet TAKE 1 TABLET BY MOUTH DAILY   omeprazole  (PRILOSEC) 40 MG capsule TAKE 1 CAPSULE BY MOUTH DAILY   triamcinolone  ointment (KENALOG ) 0.5 % Apply 1 Application topically 2 (two) times daily. To affected area on both hands until resolved.   [DISCONTINUED] clotrimazole  (GYNE-LOTRIMIN ) 1 % vaginal cream Place 1 Applicatorful vaginally at bedtime. (Patient not taking: Reported on 01/11/2024)   [DISCONTINUED] ketoconazole  (NIZORAL ) 2 % cream Apply 1 application. topically daily. (Patient not taking: Reported on 01/11/2024)   No facility-administered encounter medications on  file as of 01/11/2024.    Surgical History: Past Surgical History:  Procedure Laterality Date   COLONOSCOPY WITH PROPOFOL      COLONOSCOPY WITH PROPOFOL  N/A 01/15/2019   Procedure: COLONOSCOPY WITH PROPOFOL ;  Surgeon: Therisa Bi, MD;  Location: [facility] ENDOSCOPY;  Service: Gastroenterology;  Laterality: N/A;    Medical History: Past Medical History:  Diagnosis Date   Acid reflux    HTN (hypertension)    Nocturia    Overweight    Urinary frequency     Family History: Family History  Problem Relation Age of Onset   Hypertension Mother    Diabetes Mother    Esophageal cancer Father    Kidney disease Neg Hx    Prostate cancer Neg Hx    Bladder Cancer Neg Hx    Breast cancer Neg Hx     Social History   Socioeconomic History   Marital status: Single    Spouse name: Not on file   Number of children: Not on file   Years of education: Not on file   Highest education level: Not on file  Occupational History   Not on file  Tobacco Use   Smoking status: Never   Smokeless tobacco: Never  Vaping Use   Vaping status: Never Used  Substance and Sexual Activity   Alcohol use: No    Alcohol/week: 0.0 standard drinks of alcohol   Drug use: No   Sexual activity: Not on file  Other Topics Concern   Not on file  Social History Narrative   Not on file   Social  Drivers of Corporate Investment Banker Strain: Not on file  Food Insecurity: Not on file  Transportation Needs: Not on file  Physical Activity: Not on file  Stress: Not on file  Social Connections: Not on file  Intimate Partner Violence: Not on file      Review of Systems  Constitutional:  Negative for activity change, appetite change, chills, fatigue, fever and unexpected weight change.  HENT:  Positive for hearing loss. Negative for congestion, ear pain, rhinorrhea, sore throat and trouble swallowing.   Eyes: Negative.   Respiratory: Negative.  Negative for cough, chest tightness, shortness of breath and  wheezing.   Cardiovascular: Negative.  Negative for chest pain.  Gastrointestinal: Negative.  Negative for abdominal pain, blood in stool, constipation, diarrhea, nausea and vomiting.  Endocrine: Negative.   Genitourinary: Negative.  Negative for difficulty urinating, dysuria, frequency, hematuria and urgency.  Musculoskeletal: Negative.  Negative for arthralgias, back pain, joint swelling, myalgias and neck pain.  Skin: Negative.  Negative for rash and wound.  Allergic/Immunologic: Negative.  Negative for immunocompromised state.  Neurological: Negative.  Negative for dizziness, seizures, numbness and headaches.  Hematological: Negative.   Psychiatric/Behavioral: Negative.  Negative for behavioral problems, self-injury and suicidal ideas. The patient is not nervous/anxious.     Vital Signs: BP 136/82   Pulse 84   Temp 97.7 F (36.5 C)   Resp 16   Ht 5' 4 (1.626 m)   Wt 202 lb (91.6 kg)   SpO2 98%   BMI 34.67 kg/m    Physical Exam Vitals reviewed.  Constitutional:      General: She is not in acute distress.    Appearance: Normal appearance. She is well-developed. She is obese. She is not ill-appearing or diaphoretic.  HENT:     Head: Normocephalic and atraumatic.     Right Ear: Tympanic membrane, ear canal and external ear normal.     Left Ear: Tympanic membrane, ear canal and external ear normal.     Nose: Nose normal. No congestion or rhinorrhea.     Mouth/Throat:     Mouth: Mucous membranes are moist.     Pharynx: Oropharynx is clear. No oropharyngeal exudate or posterior oropharyngeal erythema.  Eyes:     General: No scleral icterus.       Right eye: No discharge.        Left eye: No discharge.     Conjunctiva/sclera: Conjunctivae normal.     Pupils: Pupils are equal, round, and reactive to light.  Neck:     Thyroid: No thyromegaly.     Vascular: No JVD.     Trachea: No tracheal deviation.  Cardiovascular:     Rate and Rhythm: Normal rate and regular rhythm.      Pulses: Normal pulses.     Heart sounds: Normal heart sounds. No murmur heard.    No friction rub. No gallop.  Pulmonary:     Effort: Pulmonary effort is normal. No respiratory distress.     Breath sounds: Normal breath sounds. No stridor. No wheezing or rales.  Chest:     Chest wall: No tenderness.  Breasts:    Breasts are symmetrical.     Right: Normal. No swelling, bleeding, inverted nipple, mass, nipple discharge, skin change or tenderness.     Left: Normal. No swelling, bleeding, inverted nipple, mass, nipple discharge, skin change or tenderness.  Abdominal:     General: Bowel sounds are normal. There is no distension.     Palpations: Abdomen is  soft. There is no mass.     Tenderness: There is no abdominal tenderness. There is no guarding or rebound.  Musculoskeletal:        General: No tenderness or deformity. Normal range of motion.     Cervical back: Normal range of motion and neck supple.  Lymphadenopathy:     Cervical: No cervical adenopathy.     Upper Body:     Right upper body: No supraclavicular, axillary or pectoral adenopathy.     Left upper body: No supraclavicular, axillary or pectoral adenopathy.  Skin:    General: Skin is warm and dry.     Capillary Refill: Capillary refill takes less than 2 seconds.     Coloration: Skin is not pale.     Findings: Rash (patchy eczematous rash on both hands, with dried and cracked area on knuckles.) present. No erythema.  Neurological:     Mental Status: She is alert and oriented to person, place, and time.     Cranial Nerves: No cranial nerve deficit.     Motor: No abnormal muscle tone.     Coordination: Coordination normal.     Gait: Gait normal.     Deep Tendon Reflexes: Reflexes are normal and symmetric.  Psychiatric:        Mood and Affect: Mood normal.        Behavior: Behavior normal.        Thought Content: Thought content normal.        Judgment: Judgment normal.        Assessment/Plan: 1. Encounter for  routine adult health examination with abnormal findings (Primary) Age-appropriate preventive screenings and vaccinations discussed, annual physical exam completed. Routine labs for health maintenance results discussed with the patient. PHM updated.    2. Essential hypertension Stable, continue medications as prescribed.   3. Prediabetes Stable, continue low carb low sugar diet   4. Vitamin D  deficiency Doing well, level is normal, continue OTC supplement.   5. Decreased hearing of both ears Referred to audiology for further evaluation  - Ambulatory referral to Audiology  6. Dysuria Urine sent to lab  7. Need for vaccination against Streptococcus pneumoniae - pneumococcal 20-valent conjugate vaccine (PREVNAR 20) 0.5 ML injection; Inject 0.5 mLs into the muscle once as needed for up to 1 dose for immunization.  Dispense: 0.5 mL; Refill: 0      General Counseling: Crystel verbalizes understanding of the findings of todays visit and agrees with plan of treatment. I have discussed any further diagnostic evaluation that may be needed or ordered today. We also reviewed her medications today. she has been encouraged to call the office with any questions or concerns that should arise related to todays visit.    Orders Placed This Encounter  Procedures   Ambulatory referral to Audiology    Meds ordered this encounter  Medications   pneumococcal 20-valent conjugate vaccine (PREVNAR 20) 0.5 ML injection    Sig: Inject 0.5 mLs into the muscle once as needed for up to 1 dose for immunization.    Dispense:  0.5 mL    Refill:  0    Due for prevnar 20 vaccine    Return in about 6 months (around 07/11/2024) for F/U, Saraya Tirey PCP and otherwise as needed. referred to audiology for hearing eval. .   Total time spent:30 Minutes Time spent includes review of chart, medications, test results, and follow up plan with the patient.   [place] Controlled Substance Database was reviewed by me.  This  patient was seen by [name], FNP-C in collaboration with Dr. Sigrid Bathe as a part of collaborative care agreement.  Alif Petrak R. Maxin, MSN, FNP-C Internal medicine

## 2024-01-12 ENCOUNTER — Telehealth: Payer: Self-pay | Admitting: Nurse Practitioner

## 2024-01-12 NOTE — Telephone Encounter (Signed)
 Audiology referral sent via [employer] to Dr. Ninette w/ [facility]. Lvm notifying patient. Gave patient telephone # [phone]

## 2024-01-16 LAB — UA/M W/RFLX CULTURE, ROUTINE
Bilirubin, UA: NEGATIVE
Glucose, UA: NEGATIVE
Ketones, UA: NEGATIVE
Nitrite, UA: NEGATIVE
Protein,UA: NEGATIVE
RBC, UA: NEGATIVE
Specific Gravity, UA: 1.015 (ref 1.005–1.030)
Urobilinogen, Ur: 0.2 mg/dL (ref 0.2–1.0)
pH, UA: 5 (ref 5.0–7.5)

## 2024-01-16 LAB — URINE CULTURE, REFLEX

## 2024-01-16 LAB — MICROSCOPIC EXAMINATION
Casts: NONE SEEN /LPF
RBC, Urine: NONE SEEN /HPF (ref 0–2)

## 2024-01-22 ENCOUNTER — Ambulatory Visit: Payer: Self-pay | Admitting: Internal Medicine

## 2024-01-23 MED ORDER — CIPROFLOXACIN HCL 500 MG PO TABS: 500.0000 mg | ORAL_TABLET | Freq: Two times a day (BID) | ORAL | 0 refills | Status: AC

## 2024-01-23 NOTE — Telephone Encounter (Signed)
 Left message for patient to give office a call back.

## 2024-01-23 NOTE — Telephone Encounter (Signed)
 Pt notified we sent cipro  for 5 days

## 2024-01-23 NOTE — Addendum Note (Signed)
 Addended by: Kaydance Bowie on: 01/23/2024 03:27 PM   Modules accepted: Orders

## 2024-01-23 NOTE — Progress Notes (Signed)
 Please call patient and ask if she is having any symptoms of a UTI?

## 2024-01-23 NOTE — Telephone Encounter (Signed)
-----   Message from [name], NP sent at 01/23/2024  3:27 PM EST ----- I sent cipro  500 mg twice daily for 5 days  ----- Message ----- From: [name], CMA Sent: 01/23/2024   9:50 AM EST To: [name], NP  Spoke to patient and she said yes she is having uti symptoms  ----- Message ----- From: Maxin Mardy, NP Sent: 01/23/2024   6:56 AM EST To: Jain Donelda, CMA  Please call patient and ask if she is having any symptoms of a UTI? ----- Message ----- From: Fernand Sigrid HERO, MD Sent: 01/22/2024   8:58 PM EST To: [name], NP  URINE C/S

## 2024-02-07 DIAGNOSIS — I10 Essential (primary) hypertension: Secondary | ICD-10-CM

## 2024-03-06 ENCOUNTER — Other Ambulatory Visit: Payer: Self-pay | Admitting: Nurse Practitioner

## 2024-03-06 DIAGNOSIS — Z1231 Encounter for screening mammogram for malignant neoplasm of breast: Secondary | ICD-10-CM

## 2024-03-23 ENCOUNTER — Encounter

## 2024-07-12 ENCOUNTER — Ambulatory Visit: Admitting: Nurse Practitioner

## 2025-01-11 ENCOUNTER — Encounter: Admitting: Nurse Practitioner
